# Patient Record
Sex: Female | Born: 1985 | Race: Black or African American | Hispanic: Yes | Marital: Married | Smoking: Never smoker
Health system: Southern US, Community
[De-identification: ages and names within clinical notes are randomized; demographics above are authoritative.]

## PROBLEM LIST (undated history)

## (undated) DIAGNOSIS — F419 Anxiety disorder, unspecified: Secondary | ICD-10-CM

## (undated) DIAGNOSIS — G2581 Restless legs syndrome: Secondary | ICD-10-CM

## (undated) HISTORY — PX: WISDOM TOOTH EXTRACTION: SHX21

---

## 2020-08-30 DIAGNOSIS — Z3400 Encounter for supervision of normal first pregnancy, unspecified trimester: Secondary | ICD-10-CM | POA: Diagnosis not present

## 2020-08-30 DIAGNOSIS — Z124 Encounter for screening for malignant neoplasm of cervix: Secondary | ICD-10-CM | POA: Diagnosis not present

## 2020-08-30 DIAGNOSIS — Z0373 Encounter for suspected fetal anomaly ruled out: Secondary | ICD-10-CM | POA: Diagnosis not present

## 2020-08-30 DIAGNOSIS — Z3402 Encounter for supervision of normal first pregnancy, second trimester: Secondary | ICD-10-CM | POA: Diagnosis not present

## 2020-09-11 DIAGNOSIS — O09512 Supervision of elderly primigravida, second trimester: Secondary | ICD-10-CM | POA: Diagnosis not present

## 2020-09-11 DIAGNOSIS — Z3A18 18 weeks gestation of pregnancy: Secondary | ICD-10-CM | POA: Diagnosis not present

## 2020-09-11 LAB — OB RESULTS CONSOLE HEPATITIS B SURFACE ANTIGEN: Hepatitis B Surface Ag: NEGATIVE

## 2020-09-11 LAB — OB RESULTS CONSOLE HIV ANTIBODY (ROUTINE TESTING): HIV: NONREACTIVE

## 2020-09-11 LAB — OB RESULTS CONSOLE RUBELLA ANTIBODY, IGM: Rubella: NON-IMMUNE/NOT IMMUNE

## 2020-09-12 ENCOUNTER — Inpatient Hospital Stay (HOSPITAL_COMMUNITY): Admit: 2020-09-12 | Payer: BC Managed Care – PPO | Admitting: Obstetrics and Gynecology

## 2020-09-18 DIAGNOSIS — O09512 Supervision of elderly primigravida, second trimester: Secondary | ICD-10-CM | POA: Diagnosis not present

## 2020-09-18 DIAGNOSIS — Z3A19 19 weeks gestation of pregnancy: Secondary | ICD-10-CM | POA: Diagnosis not present

## 2020-10-03 DIAGNOSIS — Z362 Encounter for other antenatal screening follow-up: Secondary | ICD-10-CM | POA: Diagnosis not present

## 2020-10-03 DIAGNOSIS — O09512 Supervision of elderly primigravida, second trimester: Secondary | ICD-10-CM | POA: Diagnosis not present

## 2020-10-03 DIAGNOSIS — Z3A21 21 weeks gestation of pregnancy: Secondary | ICD-10-CM | POA: Diagnosis not present

## 2020-10-15 DIAGNOSIS — S93601A Unspecified sprain of right foot, initial encounter: Secondary | ICD-10-CM | POA: Diagnosis not present

## 2020-10-15 DIAGNOSIS — M79671 Pain in right foot: Secondary | ICD-10-CM | POA: Diagnosis not present

## 2020-10-15 DIAGNOSIS — O09512 Supervision of elderly primigravida, second trimester: Secondary | ICD-10-CM | POA: Diagnosis not present

## 2020-10-15 DIAGNOSIS — Z3A23 23 weeks gestation of pregnancy: Secondary | ICD-10-CM | POA: Diagnosis not present

## 2020-11-13 DIAGNOSIS — Z03818 Encounter for observation for suspected exposure to other biological agents ruled out: Secondary | ICD-10-CM | POA: Diagnosis not present

## 2020-11-16 NOTE — L&D Delivery Note (Signed)
Delivery Note:   G1P0 at [redacted]w[redacted]d  Admitting diagnosis: Encounter for induction of labor [Z34.90] Risks: Marginal abruption, AMA Onset of labor: 0348 IOL/Augmentation: Pitocin ROM: 02/10/2021 0815  Complete dilation at 02/10/2021  0815 Onset of pushing at 0815 FHR second stage Cat I  Analgesia /Anesthesia intrapartum:Local  Pushing in hands and knees position on the floor with CNM and L&D staff support. Taja, wife, present for birth and supportive.  Delivery of a Live born female  Birth Weight:  2829g, 6lb 3.8oz APGAR: 9, 9  Newborn Delivery   Birth date/time: 02/10/2021 08:20:00 Delivery type: Vaginal, Spontaneous     in cephalic presentation, position OA to LOA.  APGAR:1 min-9 , 5 min-9   Nuchal Cord: No  Cord double clamped after cessation of pulsation, cut by Taja.  Collection of cord blood for typing completed. Cord blood donation-None  Arterial cord blood sample-No    Placenta delivered-Pathology  with 3 vessels . Uterotonics: Pitocin Placenta to pathology Uterine tone firm  Bleeding scant  Left sulcal laceration identified.  Episiotomy:None  Local analgesia: Lidocaine  Repair: 2-0 Vicryl in usual fashion with excellent hemostasis Est. Blood Loss (mL):100.00   Complications: None  Mom to postpartum. Baby Wells to Couplet care / Skin to Skin.  Delivery Report:   Review the Delivery Report for details.    June Leap, CNM, MSN 02/10/2021, 9:29 AM

## 2020-11-22 DIAGNOSIS — Z3689 Encounter for other specified antenatal screening: Secondary | ICD-10-CM | POA: Diagnosis not present

## 2020-11-22 DIAGNOSIS — Z23 Encounter for immunization: Secondary | ICD-10-CM | POA: Diagnosis not present

## 2020-12-08 ENCOUNTER — Other Ambulatory Visit: Payer: Self-pay

## 2020-12-08 ENCOUNTER — Encounter (HOSPITAL_COMMUNITY): Payer: Self-pay

## 2020-12-08 ENCOUNTER — Inpatient Hospital Stay (HOSPITAL_COMMUNITY)
Admission: AD | Admit: 2020-12-08 | Discharge: 2020-12-08 | Discharge status: Against Medical Advice | Payer: BC Managed Care – PPO | Attending: Obstetrics and Gynecology | Admitting: Obstetrics and Gynecology

## 2020-12-08 DIAGNOSIS — Z3689 Encounter for other specified antenatal screening: Secondary | ICD-10-CM

## 2020-12-08 DIAGNOSIS — O09513 Supervision of elderly primigravida, third trimester: Secondary | ICD-10-CM | POA: Insufficient documentation

## 2020-12-08 DIAGNOSIS — Z3A30 30 weeks gestation of pregnancy: Secondary | ICD-10-CM | POA: Diagnosis not present

## 2020-12-08 DIAGNOSIS — O368130 Decreased fetal movements, third trimester, not applicable or unspecified: Secondary | ICD-10-CM | POA: Diagnosis not present

## 2020-12-08 HISTORY — DX: Anxiety disorder, unspecified: F41.9

## 2020-12-08 HISTORY — DX: Restless legs syndrome: G25.81

## 2020-12-08 LAB — URINALYSIS, COMPLETE (UACMP) WITH MICROSCOPIC
Bilirubin Urine: NEGATIVE
Glucose, UA: NEGATIVE mg/dL
Hgb urine dipstick: NEGATIVE
Ketones, ur: NEGATIVE mg/dL
Leukocytes,Ua: NEGATIVE
Nitrite: NEGATIVE
Protein, ur: NEGATIVE mg/dL
Specific Gravity, Urine: 1.008 (ref 1.005–1.030)
pH: 8 (ref 5.0–8.0)

## 2020-12-08 NOTE — MAU Note (Signed)
Pt reports to mau with c/o DFM since Thursday.  Pt reports feeling some movement but not her normal.  Pt denies ctx or LOF. Denies vag bleeding. FHR 140

## 2020-12-08 NOTE — MAU Provider Note (Signed)
  History     CSN: [id number]  Arrival date and time: 12/08/20 1137   None    Chief Complaint  Patient presents with  . Decreased Fetal Movement   HPI Patient Megan Oconnor is a 35 y.o. G1P0 at [redacted]w[redacted]d here with complaints of decreased fetal movements. She reports that baby has not been moving as much since Thursday. Normally she feels kicks, but it has been more like rolling motions lately. She has had a normal pregnancy thus far, denies any pain with urination, dysuria, vaginal discharge, LOF, vaginal bleeding, HA, blurry vision.    OB History    Gravida  1   Para      Term      Preterm      AB      Living        SAB      IAB      Ectopic      Multiple      Live Births              Past Medical History:  Diagnosis Date  . Anxiety   . Restless leg syndrome     History reviewed. No pertinent family history.  Social History   Tobacco Use  . Smoking status: Never Smoker  . Smokeless tobacco: Never Used  Substance Use Topics  . Alcohol use: Not Currently  . Drug use: Never    Allergies: No Known Allergies  No medications prior to admission.    Review of Systems  Constitutional: Negative.   HENT: Negative.   Eyes: Visual disturbance:         Respiratory: Negative.   Neurological: Negative.   Psychiatric/Behavioral: Negative.    Physical Exam   Blood pressure 111/71, pulse 97, temperature 98.4 F (36.9 C), temperature source Oral, resp. rate 16, SpO2 99 %.  Physical Exam Constitutional:      Appearance: Normal appearance.  Cardiovascular:     Rate and Rhythm: Normal rate.  Pulmonary:     Effort: Pulmonary effort is normal.  Abdominal:     General: Abdomen is flat.  Neurological:     Mental Status: She is alert.   Cervix is long, closed, posterior.    MAU Course  Procedures  MDM -Patient was monitored for 90 minutes; she felt strong fetal movements while in MAU.  -NST: 140 bpm, mod var, present acel, no decels, irregular  contractions. Patient did not feel contractions; however, FFN collected. Cervix examined and was closed, long, posterior to FFN not sent. No further work-up for pre-term labor was performed.     Assessment and Plan   1. NST (non-stress test) reactive   -Patient stable for discharge with return precautions, reviewed normal fetal movements.  -Keep upcoming appt with [facility] -Return to MAU if any concerns for fetal movements.    Patient left before discharge could be completed.   Charlesetta Garibaldi Demarqus Jocson 12/08/2020, 7:23 PM

## 2020-12-09 ENCOUNTER — Ambulatory Visit (INDEPENDENT_AMBULATORY_CARE_PROVIDER_SITE_OTHER): Payer: BC Managed Care – PPO | Admitting: Family Medicine

## 2020-12-09 ENCOUNTER — Encounter: Payer: Self-pay | Admitting: Family Medicine

## 2020-12-09 VITALS — BP 120/72 | HR 61 | Temp 98.0°F | Wt 162.0 lb

## 2020-12-09 DIAGNOSIS — Z7689 Persons encountering health services in other specified circumstances: Secondary | ICD-10-CM

## 2020-12-09 DIAGNOSIS — N644 Mastodynia: Secondary | ICD-10-CM

## 2020-12-09 DIAGNOSIS — Z3A30 30 weeks gestation of pregnancy: Secondary | ICD-10-CM

## 2020-12-09 NOTE — Progress Notes (Signed)
   Subjective:    Patient ID: Megan Oconnor, female    DOB: 12/27/1985, 35 y.o.   MRN: [id number]  HPI Chief Complaint  Patient presents with  . new pt     New pt right breast pain, since being pregant. 8 months pregnant. Had obgyn take a look and everything was fine but wants second opinion    She is new to the practice and here for an acute complaint of breast pain. She is [redacted] weeks pregnant.  Previous medical care: she moved here from [place] in October. She was under the care of her OB/GYN and fertility specialist in [place].   Other providers: OB/GYN - Dr. Juliene Pina   States she has had right breast pain and tenderness for months. States she has seen her OB/GYN, Dr. Juliene Pina here for the same. States she wants a second opinion.  Denies any skin changes to her breasts or nipple discharge.  States she has dense breasts in general.  History of biopsy at [facility]. Results were benign per patient. No records available today.   States her pregnancy has been fine and no issues.  She was evaluated yesterday.   Denies fever, chills, chest pain, palpitations, shortness of breath, abdominal pain, N/V/D, urinary symptoms or vaginal discharge.    Social history: Lives with her wife.  works from home.  Denies smoking, drinking alcohol, drug use    Reviewed allergies, medications, past medical, surgical, family, and social history.    Review of Systems Pertinent positives and negatives in the history of present illness.     Objective:   Physical Exam Constitutional:      Appearance: Normal appearance.  Cardiovascular:     Rate and Rhythm: Normal rate and regular rhythm.     Pulses: Normal pulses.     Heart sounds: Normal heart sounds.  Pulmonary:     Effort: Pulmonary effort is normal.     Breath sounds: Normal breath sounds.  Chest:  Breasts:     Right: No mass, nipple discharge or skin change.     Left: Normal.      Comments: Firmness to right breast in the upper inner and outer  quadrants with no palpable discreet mass.  Musculoskeletal:     Cervical back: Neck supple.  Skin:    General: Skin is warm and dry.  Neurological:     General: No focal deficit present.     Mental Status: She is alert and oriented to person, place, and time.  Psychiatric:        Mood and Affect: Mood normal.    BP 120/72   Pulse 61   Temp 98 F (36.7 C)   Wt 162 lb (73.5 kg)         Assessment & Plan:  Breast pain  [redacted] weeks gestation of pregnancy  Encounter to establish care  She is a pleasant 35 year old new female here to establish care. I will request medical records from her providers in [place].  Discussed that I agree with her OB/GYN that it is ok to wait until after delivery of her baby to evaluate her breast symptoms further. She will monitor for any new or worsening symptoms especially any signs of infection and follow up with her OB/GYN.

## 2020-12-09 NOTE — Patient Instructions (Signed)
It was a pleasure to meet you.  Keep an eye on things and let Dr. Juliene Pina know if you are experiencing any new symptoms.

## 2020-12-11 DIAGNOSIS — S93601A Unspecified sprain of right foot, initial encounter: Secondary | ICD-10-CM | POA: Diagnosis not present

## 2020-12-11 DIAGNOSIS — M79671 Pain in right foot: Secondary | ICD-10-CM | POA: Diagnosis not present

## 2020-12-17 DIAGNOSIS — O2613 Low weight gain in pregnancy, third trimester: Secondary | ICD-10-CM | POA: Diagnosis not present

## 2020-12-17 DIAGNOSIS — O09529 Supervision of elderly multigravida, unspecified trimester: Secondary | ICD-10-CM | POA: Diagnosis not present

## 2020-12-17 DIAGNOSIS — O365930 Maternal care for other known or suspected poor fetal growth, third trimester, not applicable or unspecified: Secondary | ICD-10-CM | POA: Diagnosis not present

## 2020-12-17 DIAGNOSIS — Z3A32 32 weeks gestation of pregnancy: Secondary | ICD-10-CM | POA: Diagnosis not present

## 2021-01-16 DIAGNOSIS — Z3A36 36 weeks gestation of pregnancy: Secondary | ICD-10-CM | POA: Diagnosis not present

## 2021-01-16 DIAGNOSIS — Z3685 Encounter for antenatal screening for Streptococcus B: Secondary | ICD-10-CM | POA: Diagnosis not present

## 2021-01-16 DIAGNOSIS — O2613 Low weight gain in pregnancy, third trimester: Secondary | ICD-10-CM | POA: Diagnosis not present

## 2021-01-23 DIAGNOSIS — Z3685 Encounter for antenatal screening for Streptococcus B: Secondary | ICD-10-CM | POA: Diagnosis not present

## 2021-01-23 LAB — OB RESULTS CONSOLE GBS: GBS: NEGATIVE

## 2021-02-06 DIAGNOSIS — O368399 Maternal care for abnormalities of the fetal heart rate or rhythm, unspecified trimester, other fetus: Secondary | ICD-10-CM | POA: Diagnosis not present

## 2021-02-06 DIAGNOSIS — Z3A38 38 weeks gestation of pregnancy: Secondary | ICD-10-CM | POA: Diagnosis not present

## 2021-02-09 ENCOUNTER — Inpatient Hospital Stay (HOSPITAL_COMMUNITY)
Admission: AD | Admit: 2021-02-09 | Discharge: 2021-02-11 | DRG: 805 | Disposition: A | Discharge status: Home / Self Care | Payer: BC Managed Care – PPO | Attending: Obstetrics | Admitting: Obstetrics

## 2021-02-09 ENCOUNTER — Encounter (HOSPITAL_COMMUNITY): Payer: Self-pay | Admitting: Obstetrics & Gynecology

## 2021-02-09 DIAGNOSIS — O368130 Decreased fetal movements, third trimester, not applicable or unspecified: Principal | ICD-10-CM | POA: Diagnosis present

## 2021-02-09 DIAGNOSIS — O4593 Premature separation of placenta, unspecified, third trimester: Secondary | ICD-10-CM | POA: Diagnosis present

## 2021-02-09 DIAGNOSIS — O368190 Decreased fetal movements, unspecified trimester, not applicable or unspecified: Secondary | ICD-10-CM

## 2021-02-09 DIAGNOSIS — O4590 Premature separation of placenta, unspecified, unspecified trimester: Secondary | ICD-10-CM | POA: Diagnosis present

## 2021-02-09 DIAGNOSIS — Z23 Encounter for immunization: Secondary | ICD-10-CM

## 2021-02-09 DIAGNOSIS — Z3490 Encounter for supervision of normal pregnancy, unspecified, unspecified trimester: Secondary | ICD-10-CM | POA: Diagnosis present

## 2021-02-09 DIAGNOSIS — Z3A38 38 weeks gestation of pregnancy: Secondary | ICD-10-CM

## 2021-02-09 NOTE — MAU Provider Note (Signed)
S Ms. Megan Oconnor is a 35 y.o. G1P0 patient who presents to MAU today with complaint of decreased fetal movement..   O BP (!) 132/91   Pulse 69   Resp 16   Wt 74.9 kg   SpO2 99%  Physical Exam Vitals and nursing note reviewed.  Constitutional:      General: She is not in acute distress.    Appearance: She is well-developed.  HENT:     Head: Normocephalic.  Cardiovascular:     Rate and Rhythm: Normal rate.  Pulmonary:     Effort: Pulmonary effort is normal. No respiratory distress.  Skin:    General: Skin is warm and dry.  Neurological:     Mental Status: She is alert and oriented to person, place, and time.  Psychiatric:        Behavior: Behavior normal.        Thought Content: Thought content normal.        Judgment: Judgment normal.     A Medical screening exam complete   P Patient is being managed by CNM from [facility]. Medical screening completed per policy.  Rolm Bookbinder, [place] 02/09/2021 11:58 PM

## 2021-02-09 NOTE — Progress Notes (Deleted)
S Ms. Megan Oconnor is a 34 y.o. G1P0 patient who presents to MAU today with complaint of decreased fetal movement..   O BP (!) 132/91   Pulse 69   Resp 16   Wt 74.9 kg   SpO2 99%  Physical Exam Vitals and nursing note reviewed.  Constitutional:      General: She is not in acute distress.    Appearance: She is well-developed.  HENT:     Head: Normocephalic.  Cardiovascular:     Rate and Rhythm: Normal rate.  Pulmonary:     Effort: Pulmonary effort is normal. No respiratory distress.  Skin:    General: Skin is warm and dry.  Neurological:     Mental Status: She is alert and oriented to person, place, and time.  Psychiatric:        Behavior: Behavior normal.        Thought Content: Thought content normal.        Judgment: Judgment normal.     A Medical screening exam complete   P Patient is being managed by CNM from [facility]. Medical screening completed per policy.  Neill, Caroline M, CNM 02/09/2021 11:58 PM     

## 2021-02-09 NOTE — MAU Note (Signed)
Reports decreased FM that started today. Reports baby is usually very active in the afternoon on, but hasnt felt baby move. Pt called office and was told to come to MAU for NST. Dorisann Frames notified and BPP and NST ordered.

## 2021-02-10 ENCOUNTER — Inpatient Hospital Stay (HOSPITAL_BASED_OUTPATIENT_CLINIC_OR_DEPARTMENT_OTHER): Payer: BC Managed Care – PPO

## 2021-02-10 ENCOUNTER — Encounter (HOSPITAL_COMMUNITY): Payer: Self-pay | Admitting: Obstetrics

## 2021-02-10 ENCOUNTER — Other Ambulatory Visit: Payer: Self-pay

## 2021-02-10 DIAGNOSIS — Z2882 Immunization not carried out because of caregiver refusal: Secondary | ICD-10-CM | POA: Diagnosis not present

## 2021-02-10 DIAGNOSIS — Z3A38 38 weeks gestation of pregnancy: Secondary | ICD-10-CM | POA: Diagnosis not present

## 2021-02-10 DIAGNOSIS — Z3490 Encounter for supervision of normal pregnancy, unspecified, unspecified trimester: Secondary | ICD-10-CM | POA: Diagnosis present

## 2021-02-10 DIAGNOSIS — O4590 Premature separation of placenta, unspecified, unspecified trimester: Secondary | ICD-10-CM | POA: Diagnosis present

## 2021-02-10 DIAGNOSIS — O4593 Premature separation of placenta, unspecified, third trimester: Secondary | ICD-10-CM | POA: Diagnosis not present

## 2021-02-10 DIAGNOSIS — O368130 Decreased fetal movements, third trimester, not applicable or unspecified: Secondary | ICD-10-CM | POA: Diagnosis not present

## 2021-02-10 DIAGNOSIS — Z3A39 39 weeks gestation of pregnancy: Secondary | ICD-10-CM

## 2021-02-10 DIAGNOSIS — Z23 Encounter for immunization: Secondary | ICD-10-CM | POA: Diagnosis not present

## 2021-02-10 DIAGNOSIS — O43893 Other placental disorders, third trimester: Secondary | ICD-10-CM | POA: Diagnosis not present

## 2021-02-10 LAB — CBC WITH DIFFERENTIAL/PLATELET
Abs Immature Granulocytes: 0.01 10*3/uL (ref 0.00–0.07)
Basophils Absolute: 0 10*3/uL (ref 0.0–0.1)
Basophils Relative: 1 %
Eosinophils Absolute: 0.1 10*3/uL (ref 0.0–0.5)
Eosinophils Relative: 1 %
HCT: 35.8 % — ABNORMAL LOW (ref 36.0–46.0)
Hemoglobin: 12.1 g/dL (ref 12.0–15.0)
Immature Granulocytes: 0 %
Lymphocytes Relative: 23 %
Lymphs Abs: 1.5 10*3/uL (ref 0.7–4.0)
MCH: 30.5 pg (ref 26.0–34.0)
MCHC: 33.8 g/dL (ref 30.0–36.0)
MCV: 90.2 fL (ref 80.0–100.0)
Monocytes Absolute: 0.4 10*3/uL (ref 0.1–1.0)
Monocytes Relative: 6 %
Neutro Abs: 4.5 10*3/uL (ref 1.7–7.7)
Neutrophils Relative %: 69 %
Platelets: 215 10*3/uL (ref 150–400)
RBC: 3.97 MIL/uL (ref 3.87–5.11)
RDW: 14.2 % (ref 11.5–15.5)
WBC: 6.4 10*3/uL (ref 4.0–10.5)
nRBC: 0 % (ref 0.0–0.2)

## 2021-02-10 LAB — COMPREHENSIVE METABOLIC PANEL
ALT: 17 U/L (ref 0–44)
AST: 26 U/L (ref 15–41)
Albumin: 2.8 g/dL — ABNORMAL LOW (ref 3.5–5.0)
Alkaline Phosphatase: 173 U/L — ABNORMAL HIGH (ref 38–126)
Anion gap: 6 (ref 5–15)
BUN: 13 mg/dL (ref 6–20)
CO2: 20 mmol/L — ABNORMAL LOW (ref 22–32)
Calcium: 8.9 mg/dL (ref 8.9–10.3)
Chloride: 107 mmol/L (ref 98–111)
Creatinine, Ser: 0.98 mg/dL (ref 0.44–1.00)
GFR, Estimated: >60 mL/min (ref >60)
Glucose, Bld: 124 mg/dL — ABNORMAL HIGH (ref 70–99)
Potassium: 3.3 mmol/L — ABNORMAL LOW (ref 3.5–5.1)
Sodium: 133 mmol/L — ABNORMAL LOW (ref 135–145)
Total Bilirubin: 0.5 mg/dL (ref 0.3–1.2)
Total Protein: 6 g/dL — ABNORMAL LOW (ref 6.5–8.1)

## 2021-02-10 LAB — APTT: aPTT: 30 seconds (ref 24–36)

## 2021-02-10 LAB — URIC ACID: Uric Acid, Serum: 6.8 mg/dL (ref 2.5–7.1)

## 2021-02-10 LAB — TYPE AND SCREEN
ABO/RH(D): A POS
Antibody Screen: NEGATIVE

## 2021-02-10 LAB — PROTIME-INR
INR: 0.9 (ref 0.8–1.2)
Prothrombin Time: 11.6 seconds (ref 11.4–15.2)

## 2021-02-10 LAB — PROTEIN / CREATININE RATIO, URINE
Creatinine, Urine: 53.11 mg/dL
Total Protein, Urine: <6 mg/dL

## 2021-02-10 LAB — RPR: RPR Ser Ql: NONREACTIVE

## 2021-02-10 LAB — LACTATE DEHYDROGENASE: LDH: 155 U/L (ref 98–192)

## 2021-02-10 MED ORDER — ACETAMINOPHEN 500 MG PO TABS: 1000.0000 mg | ORAL_TABLET | Freq: Four times a day (QID) | ORAL | Status: DC | PRN

## 2021-02-10 MED ORDER — SOD CITRATE-CITRIC ACID 500-334 MG/5ML PO SOLN
30.0000 mL | ORAL | Status: DC | PRN
  Administered 2021-02-10: 30 mL via ORAL
  Filled 2021-02-10: qty 15

## 2021-02-10 MED ORDER — DIBUCAINE (PERIANAL) 1 % EX OINT
1.0000 "application " | TOPICAL_OINTMENT | CUTANEOUS | Status: DC | PRN
  Administered 2021-02-10: 1 via RECTAL
  Filled 2021-02-10: qty 28

## 2021-02-10 MED ORDER — ZOLPIDEM TARTRATE 5 MG PO TABS: 5.0000 mg | ORAL_TABLET | Freq: Every evening | ORAL | Status: DC | PRN

## 2021-02-10 MED ORDER — OXYTOCIN-SODIUM CHLORIDE 30-0.9 UT/500ML-% IV SOLN
2.5000 [IU]/h | INTRAVENOUS | Status: DC
  Administered 2021-02-10: 2.5 [IU]/h via INTRAVENOUS

## 2021-02-10 MED ORDER — PRENATAL MULTIVITAMIN CH
1.0000 | ORAL_TABLET | Freq: Every day | ORAL | Status: DC
  Administered 2021-02-11: 1 via ORAL
  Filled 2021-02-10: qty 1

## 2021-02-10 MED ORDER — FENTANYL CITRATE (PF) 100 MCG/2ML IJ SOLN
50.0000 ug | INTRAMUSCULAR | Status: DC | PRN
  Administered 2021-02-10: 50 ug via INTRAVENOUS
  Filled 2021-02-10: qty 2

## 2021-02-10 MED ORDER — ONDANSETRON HCL 4 MG PO TABS: 4.0000 mg | ORAL_TABLET | ORAL | Status: DC | PRN

## 2021-02-10 MED ORDER — TERBUTALINE SULFATE 1 MG/ML IJ SOLN: 0.2500 mg | Freq: Once | INTRAMUSCULAR | Status: DC | PRN

## 2021-02-10 MED ORDER — LACTATED RINGERS IV SOLN: 500.0000 mL | INTRAVENOUS | Status: DC | PRN

## 2021-02-10 MED ORDER — SODIUM CHLORIDE 0.9% FLUSH: 3.0000 mL | INTRAVENOUS | Status: DC | PRN

## 2021-02-10 MED ORDER — DIPHENHYDRAMINE HCL 25 MG PO CAPS: 25.0000 mg | ORAL_CAPSULE | Freq: Four times a day (QID) | ORAL | Status: DC | PRN

## 2021-02-10 MED ORDER — TETANUS-DIPHTH-ACELL PERTUSSIS 5-2.5-18.5 LF-MCG/0.5 IM SUSY: 0.5000 mL | PREFILLED_SYRINGE | Freq: Once | INTRAMUSCULAR | Status: DC

## 2021-02-10 MED ORDER — ONDANSETRON HCL 4 MG/2ML IJ SOLN: 4.0000 mg | Freq: Four times a day (QID) | INTRAMUSCULAR | Status: DC | PRN

## 2021-02-10 MED ORDER — LACTATED RINGERS IV SOLN: INTRAVENOUS | Status: DC

## 2021-02-10 MED ORDER — COCONUT OIL OIL
1.0000 | TOPICAL_OIL | Status: DC | PRN
Start: 2021-02-10 — End: 2021-02-12

## 2021-02-10 MED ORDER — ONDANSETRON HCL 4 MG/2ML IJ SOLN: 4.0000 mg | INTRAMUSCULAR | Status: DC | PRN

## 2021-02-10 MED ORDER — SIMETHICONE 80 MG PO CHEW: 80.0000 mg | CHEWABLE_TABLET | ORAL | Status: DC | PRN

## 2021-02-10 MED ORDER — OXYTOCIN BOLUS FROM INFUSION
333.0000 mL | Freq: Once | INTRAVENOUS | Status: AC
  Administered 2021-02-10: 333 mL via INTRAVENOUS

## 2021-02-10 MED ORDER — WITCH HAZEL-GLYCERIN EX PADS
1.0000 | MEDICATED_PAD | CUTANEOUS | Status: DC | PRN
Start: 2021-02-10 — End: 2021-02-12
  Administered 2021-02-10: 1 via TOPICAL

## 2021-02-10 MED ORDER — SENNOSIDES-DOCUSATE SODIUM 8.6-50 MG PO TABS
2.0000 | ORAL_TABLET | Freq: Every day | ORAL | Status: DC
  Filled 2021-02-10: qty 2

## 2021-02-10 MED ORDER — ACETAMINOPHEN 325 MG PO TABS: 650.0000 mg | ORAL_TABLET | ORAL | Status: DC | PRN

## 2021-02-10 MED ORDER — SODIUM CHLORIDE 0.9% FLUSH: 3.0000 mL | Freq: Two times a day (BID) | INTRAVENOUS | Status: DC

## 2021-02-10 MED ORDER — IBUPROFEN 600 MG PO TABS: 600.0000 mg | ORAL_TABLET | Freq: Four times a day (QID) | ORAL | Status: DC

## 2021-02-10 MED ORDER — SODIUM CHLORIDE 0.9 % IV SOLN: 250.0000 mL | INTRAVENOUS | Status: DC | PRN

## 2021-02-10 MED ORDER — BENZOCAINE-MENTHOL 20-0.5 % EX AERO
1.0000 | INHALATION_SPRAY | CUTANEOUS | Status: DC | PRN
Start: 2021-02-10 — End: 2021-02-12
  Administered 2021-02-10: 1 via TOPICAL
  Filled 2021-02-10: qty 56

## 2021-02-10 MED ORDER — LACTATED RINGERS IV BOLUS
500.0000 mL | Freq: Once | INTRAVENOUS | Status: AC
  Administered 2021-02-10: 500 mL via INTRAVENOUS

## 2021-02-10 MED ORDER — OXYTOCIN 10 UNIT/ML IJ SOLN: 10.0000 [IU] | Freq: Once | INTRAMUSCULAR | Status: DC

## 2021-02-10 MED ORDER — LIDOCAINE HCL (PF) 1 % IJ SOLN
30.0000 mL | INTRAMUSCULAR | Status: AC | PRN
  Administered 2021-02-10: 30 mL via SUBCUTANEOUS
  Filled 2021-02-10: qty 30

## 2021-02-10 MED ORDER — OXYTOCIN-SODIUM CHLORIDE 30-0.9 UT/500ML-% IV SOLN
1.0000 m[IU]/min | INTRAVENOUS | Status: DC
  Administered 2021-02-10: 2 m[IU]/min via INTRAVENOUS
  Filled 2021-02-10: qty 500

## 2021-02-10 NOTE — H&P (Signed)
OB ADMISSION/ HISTORY & PHYSICAL:  Admission Date: 02/09/2021 11:19 PM  Admit Diagnosis: Encounter for induction of labor [Z34.90]    Megan Oconnor is a 35 y.o. female at 61w4dpresenting initially for decreased fetal movement. Patient contacted CNM by phone and stated that she had decreased fetal movement throughout the day and no movement for the last several hours. Patient was sent to MAU for evaluation. Initial NST in MAU was reactive and reassuring. Patient was sent for U/S that showed a marginal placental abruption and BPP 8/8. Patient denies abdominal pain or vaginal bleeding. Reports occasional contractions. Denies leaking of fluid.   Patient and wife counseled on the management of marginal abruption at term and both desire to proceed with induction of labor. Discussed the R/B/A of induction of labor including risk for PLTCS for fetal distress and patient consents to induction.   Wife, TFransico Meadow is labor support. Eagerly anticipating baby girl "Wells".   Prenatal History: G1P0   EDC : 02/20/2021, by 7 week U/S Prenatal care at [facility] since 24 weeks, transfer of care [facility] primary Dr. MBenjie Karvonen transferred to midwifery care at 32 weeks  Prenatal course complicated by: 1. Low maternal weight gain 2. AMA 3. Rubella non-immune 4. Anxiety  Prenatal Labs: ABO, Rh:   A POS Antibody: NEG (03/28 0115) Rubella:   Non-immune RPR:   Non-reactive HBsAg:   Negative HIV:   Negative GBS:   Negative 1 hr Glucola : 92 Genetic Screening: low risk XX Panorama, neg AFP-1 Ultrasound: normal XX anatomy, anterior placenta, AFI WNL, last EFW at 34 weeks 23% 02/10/2021 - small inferior marginal placental abruption measuring 4.2 x 1.1 x 2cm  TDaP         UTD Flu             UTD COVID-19 UTD    Maternal Diabetes: No Genetic Screening: Normal Maternal Ultrasounds/Referrals: Other: marginal placental abruption on 02/10/2021 Fetal Ultrasounds or other Referrals:  None Maternal Substance Abuse:   No Significant Maternal Medications:  None Significant Maternal Lab Results:  Group B Strep negative Other Comments:  None  Medical / Surgical History : Past medical history:  Past Medical History:  Diagnosis Date  . Anxiety   . Restless leg syndrome     Past surgical history:  Past Surgical History:  Procedure Laterality Date  . WISDOM TOOTH EXTRACTION      Family History: History reviewed. No pertinent family history.   Social History:  reports that she has never smoked. She has never used smokeless tobacco. She reports previous alcohol use. She reports that she does not use drugs.  Allergies: Patient has no known allergies.   Current Medications at time of admission:  Medications Prior to Admission  Medication Sig Dispense Refill Last Dose  . calcium carbonate (TUMS - DOSED IN MG ELEMENTAL CALCIUM) 500 MG chewable tablet Chew 1 tablet by mouth daily.   02/09/2021 at Unknown time  . MAGNESIUM CARBONATE PO Take by mouth.   02/09/2021 at Unknown time  . Prenatal Vit-Fe Fumarate-FA (MULTIVITAMIN-PRENATAL) 27-0.8 MG TABS tablet Take 1 tablet by mouth daily at 12 noon.   02/09/2021 at Unknown time  . valACYclovir (VALTREX) 1000 MG tablet Take 1,000 mg by mouth daily.   02/09/2021 at Unknown time  . ZINC OXIDE PO Take by mouth.   02/09/2021 at Unknown time  . Docosahexaenoic Acid (DHA PO) Take by mouth.     . famotidine (PEPCID) 10 MG tablet Take 10 mg by mouth 2 (two)  times daily.   Unknown at Unknown time    Review of Systems: Review of Systems  Constitutional: Negative for chills and fever.  Eyes: Negative for blurred vision.  Respiratory: Negative for cough and shortness of breath.   Cardiovascular: Negative for chest pain and leg swelling.  Gastrointestinal: Positive for constipation, heartburn, nausea and vomiting. Negative for abdominal pain.  Musculoskeletal: Negative for falls.  Neurological: Negative for dizziness and headaches.  Psychiatric/Behavioral: Negative for  depression.   Physical Exam: Vital signs and nursing notes reviewed.  Patient Vitals for the past 24 hrs:  BP Temp src Pulse Resp SpO2 Height Weight  02/10/21 0325 121/89 -- 76 18 -- -- --  02/10/21 0324 -- Oral -- 18 -- 5' 9"  (1.753 m) 74.4 kg  02/10/21 0308 (!) 134/91 -- 71 18 -- -- --  02/10/21 0230 (!) 132/100 -- 74 -- -- -- --  02/10/21 0215 (!) 134/103 -- 76 -- -- -- --  02/10/21 0145 123/90 -- 73 -- -- -- --  02/10/21 0130 115/87 -- 82 -- -- -- --  02/10/21 0115 119/90 -- 71 19 -- -- --  02/10/21 0100 136/83 -- 61 -- -- -- --  02/10/21 0051 108/82 -- 76 -- -- -- --  02/10/21 0050 114/81 -- 69 17 -- -- --  02/10/21 0000 -- -- -- -- 99 % -- --  02/09/21 2355 -- -- -- -- 99 % -- --  02/09/21 2350 -- -- -- -- 98 % -- --  02/09/21 2345 -- -- -- -- 98 % -- --  02/09/21 2344 -- -- -- -- 98 % -- --  02/09/21 2343 -- -- -- 16 -- -- 74.9 kg  02/09/21 2340 -- -- -- -- 99 % -- --  02/09/21 2336 (!) 132/91 -- 69 -- -- -- --    General: AAO x 3, NAD Heart: RRR Lungs:CTAB Abdomen: Gravid, NT, Leopold's vertex Extremities: no edema Genitalia / VE: Dilation: 3 Effacement (%): 60 Station: -2 Presentation: Vertex Exam by:: Florette Thai CNM   FHR: 120BPM, mod variability, + accels, no decels TOCO: Contractions occasional  Labs:   Results for orders placed or performed during the hospital encounter of 02/09/21 (from the past 24 hour(s))  Comprehensive metabolic panel     Status: Abnormal   Collection Time: 02/10/21  1:02 AM  Result Value Ref Range   Sodium 133 (L) 135 - 145 mmol/L   Potassium 3.3 (L) 3.5 - 5.1 mmol/L   Chloride 107 98 - 111 mmol/L   CO2 20 (L) 22 - 32 mmol/L   Glucose, Bld 124 (H) 70 - 99 mg/dL   BUN 13 6 - 20 mg/dL   Creatinine, Ser 0.98 0.44 - 1.00 mg/dL   Calcium 8.9 8.9 - 10.3 mg/dL   Total Protein 6.0 (L) 6.5 - 8.1 g/dL   Albumin 2.8 (L) 3.5 - 5.0 g/dL   AST 26 15 - 41 U/L   ALT 17 0 - 44 U/L   Alkaline Phosphatase 173 (H) 38 - 126 U/L   Total Bilirubin  0.5 0.3 - 1.2 mg/dL   GFR, Estimated >60 >60 mL/min   Anion gap 6 5 - 15  CBC with Differential     Status: Abnormal   Collection Time: 02/10/21  1:02 AM  Result Value Ref Range   WBC 6.4 4.0 - 10.5 K/uL   RBC 3.97 3.87 - 5.11 MIL/uL   Hemoglobin 12.1 12.0 - 15.0 g/dL   HCT 35.8 (L) 36.0 - 46.0 %  MCV 90.2 80.0 - 100.0 fL   MCH 30.5 26.0 - 34.0 pg   MCHC 33.8 30.0 - 36.0 g/dL   RDW 14.2 11.5 - 15.5 %   Platelets 215 150 - 400 K/uL   nRBC 0.0 0.0 - 0.2 %   Neutrophils Relative % 69 %   Neutro Abs 4.5 1.7 - 7.7 K/uL   Lymphocytes Relative 23 %   Lymphs Abs 1.5 0.7 - 4.0 K/uL   Monocytes Relative 6 %   Monocytes Absolute 0.4 0.1 - 1.0 K/uL   Eosinophils Relative 1 %   Eosinophils Absolute 0.1 0.0 - 0.5 K/uL   Basophils Relative 1 %   Basophils Absolute 0.0 0.0 - 0.1 K/uL   Immature Granulocytes 0 %   Abs Immature Granulocytes 0.01 0.00 - 0.07 K/uL  Protein / creatinine ratio, urine     Status: None   Collection Time: 02/10/21  1:04 AM  Result Value Ref Range   Creatinine, Urine 53.11 mg/dL   Total Protein, Urine <6 mg/dL   Protein Creatinine Ratio        0.00 - 0.15 mg/mg[Cre]  Type and screen [facility]     Status: None   Collection Time: 02/10/21  1:15 AM  Result Value Ref Range   ABO/RH(D) A POS    Antibody Screen NEG    Sample Expiration      02/13/2021,2359 Performed at [facility], [place] [address] [place], [place] [address]   APTT     Status: None   Collection Time: 02/10/21  2:43 AM  Result Value Ref Range   aPTT 30 24 - 36 seconds  Protime-INR     Status: None   Collection Time: 02/10/21  2:43 AM  Result Value Ref Range   Prothrombin Time 11.6 11.4 - 15.2 seconds   INR 0.9 0.8 - 1.2  Lactate dehydrogenase     Status: None   Collection Time: 02/10/21  2:43 AM  Result Value Ref Range   LDH 155 98 - 192 U/L  Uric acid     Status: None   Collection Time: 02/10/21  2:43 AM  Result Value Ref Range   Uric Acid, Serum 6.8 2.5 - 7.1  mg/dL   Assessment:  35 y.o. G1P0 at [redacted]w[redacted]d marginal placental abruption  1. Induction of labor due to marginal placental abruption 2. FHR category I 3. GBS negative 4. Plans unmedicated labor, open to epidural 5. Plans to breastfeed 6. Rubella non-immune 7. Anxiety, stable and no current medications  Plan:  1. Admit to BS 2. Routine L&D orders 3. Analgesia/anesthesia PRN  4. PEC labs WNL, diastolic BP elevated 863-149F will continue to monitor closely 5. Send placenta to pathology  6. Will start Pitocin 2x2, consider AROM in active labor 7. Offer MMR postpartum 8. Close PP F/U for mood 9. Anticipate NSVB  Dr. FPamala Hurrynotified of admission/plan of care.  ASuzan NailerCNM, MSN 02/10/2021, 4:10 AM

## 2021-02-10 NOTE — MAU Note (Signed)
Pt continues to deny abdominal or uterine pain or discomfort. States she feels "her braxton hicks": contractions. Edwin Cap charge RN called with report and order for admission.

## 2021-02-10 NOTE — MAU Note (Signed)
Plan of care discussed with pt and with orders received from Dorisann Frames CNM. Both acceptable with plan.

## 2021-02-10 NOTE — Lactation Note (Signed)
This note was copied from a baby's chart. Lactation Consultation Note  Patient Name: Megan Oconnor IONGE'X Date: 02/10/2021 Reason for consult: L&D Initial assessment;Primapara Age:36 hours  Mom is a P1 who reports + breast changes w/pregnancy. Infant latches with ease. Mom just needed some verbal cueing to assist with improving latch. Hand expression was taught to Mom (with some resulting colostrum seen), but she may need review at a later time.   Lurline Hare [facility] 02/10/2021, 9:42 AM

## 2021-02-10 NOTE — Progress Notes (Signed)
S: Feeling strong contractions for the last 45 minutes. Desires SVE. Wife, Megan Oconnor, at the bedside providing support.   O: Vitals:   02/10/21 0504 02/10/21 0548 02/10/21 0601 02/10/21 0631  BP: (!) 125/94 (!) 143/92 114/87 137/88  Pulse: 66 (!) 58 64 (!) 57  Resp: 18 18 18 18   Temp:    97.8 F (36.6 C)  TempSrc:    Oral  SpO2:      Weight:      Height:       FHT:  FHR: 130 bpm, variability: moderate,  accelerations:  Present,  decelerations:  Absent UC:   regular, every 2 minutes SVE:   Dilation: 6 Effacement (%): 100 Station: -2 Exam by:: A Chessa Barrasso CNM   BBOW  A / P: Induction of labor due to abruptio placentae, progressing well on pitocin  Fetal Wellbeing:  Category I Pain Control:  Labor support without medications Anticipated MOD:  NSVD   Dr. [phone] updated on patient status and plan of care.   Ernestina Penna, CNM, MSN 02/10/2021, 7:55 AM

## 2021-02-10 NOTE — Lactation Note (Signed)
This note was copied from a baby's chart. Lactation Consultation Note  Patient Name: Megan Oconnor Date: 02/10/2021 Reason for consult: Mother's request;Primapara;1st time breastfeeding;Early term 37-38.6wks;Initial assessment Age: 36 hrs Infant just fed 1 hr prior to arrival for 30 minutes.   Mom denied any pain with the latch. Her nipples are erect with no signs of trauma.   LC set up manual pump and reviewed pump parts, assembly, cleaning and storage. Mom did not want to try the pump to assess flange size at the time of the visit.   Plan 1. To feed based on cues 8-12x in 24 hrs no more than 3 hrs without an attempt. Mom to offer both breasts and look for swallows.          2. Mom pre pump for 5-10 minutes to increase let down.           3. I and O sheet reviewed.           4. LC brochure reviewed.   Maternal Data Has patient been taught Hand Expression?: Yes Does the patient have breastfeeding experience prior to this delivery?: No  Feeding Mother's Current Feeding Choice: Breast Milk  LATCH Score                    Lactation Tools Discussed/Used Tools: Pump Breast pump type: Manual Pump Education: Setup, frequency, and cleaning;Milk Storage Reason for Pumping: increase stimulation Pumping frequency: every 3 hrs for 15 minutes  Interventions Interventions: Breast feeding basics reviewed;Support pillows;Education;Position options;Skin to skin;Expressed milk;Hand express;Hand pump;Breast compression  Discharge Pump: Personal WIC Program: No  Consult Status Consult Status: Follow-up Date: 02/11/21 Follow-up type: In-patient    Carrell Palmatier  [place] 02/10/2021, 5:56 PM

## 2021-02-11 LAB — CBC
HCT: 32.7 % — ABNORMAL LOW (ref 36.0–46.0)
Hemoglobin: 10.8 g/dL — ABNORMAL LOW (ref 12.0–15.0)
MCH: 29.8 pg (ref 26.0–34.0)
MCHC: 33 g/dL (ref 30.0–36.0)
MCV: 90.1 fL (ref 80.0–100.0)
Platelets: 191 10*3/uL (ref 150–400)
RBC: 3.63 MIL/uL — ABNORMAL LOW (ref 3.87–5.11)
RDW: 14.2 % (ref 11.5–15.5)
WBC: 8.5 10*3/uL (ref 4.0–10.5)
nRBC: 0 % (ref 0.0–0.2)

## 2021-02-11 MED ORDER — COCONUT OIL OIL: 1.0000 "application " | TOPICAL_OIL | 0 refills | Status: DC | PRN

## 2021-02-11 MED ORDER — MEASLES, MUMPS & RUBELLA VAC IJ SOLR
0.5000 mL | Freq: Once | INTRAMUSCULAR | Status: AC
  Administered 2021-02-11: 0.5 mL via SUBCUTANEOUS
  Filled 2021-02-11: qty 0.5

## 2021-02-11 MED ORDER — IBUPROFEN 600 MG PO TABS: 600.0000 mg | ORAL_TABLET | Freq: Four times a day (QID) | ORAL | 0 refills | Status: DC

## 2021-02-11 MED ORDER — BENZOCAINE-MENTHOL 20-0.5 % EX AERO: 1.0000 "application " | INHALATION_SPRAY | CUTANEOUS | Status: DC | PRN

## 2021-02-11 MED ORDER — ACETAMINOPHEN 500 MG PO TABS: 1000.0000 mg | ORAL_TABLET | Freq: Four times a day (QID) | ORAL | 2 refills | Status: AC | PRN

## 2021-02-11 NOTE — Discharge Instructions (Signed)
Lactation outpatient support - home visit  Linda Coppola RN, MHA, IBCLC at [facility]: Lactation Consultant  [email or web address] Mail: [email or web address] Tel: [phone]    Additional resources:  [facility] [email or web address]   Chiropractic specialist   [email or web address] Leanna Hastings [email or web address]  Craniosacral therapy for baby  Erin Balkind  [email or web address]  

## 2021-02-11 NOTE — Discharge Summary (Incomplete)
OB Discharge Summary  Patient Name: Megan Oconnor DOB: [birth year] MRN: [id number]  Date of admission: 02/09/2021 Delivering provider: Dorisann Frames K   Admitting diagnosis: Encounter for induction of labor [Z34.90] Intrauterine pregnancy: [redacted]w[redacted]d     Secondary diagnosis: Patient Active Problem List   Diagnosis Date Noted  . Encounter for induction of labor 02/10/2021  . Marginal placental abruption 02/10/2021  . SVD 3/28 02/10/2021  . Postpartum care following vaginal delivery 3/28 02/10/2021  . Laceration of vaginal wall or sulcus without perineal laceration during delivery 02/10/2021     Date of discharge: 02/11/2021   Discharge diagnosis: Principal Problem:   Postpartum care following vaginal delivery 3/28 Active Problems:   Encounter for induction of labor   Marginal placental abruption   SVD 3/28   Laceration of vaginal wall or sulcus without perineal laceration during delivery                                                              Post partum procedures:{Postpartum procedures:23558}  Augmentation: {Augmentation:20782} Pain control: Local  Laceration:Sulcus  Episiotomy:None  Complications: {OB Labor/Delivery Complications:20784}  Hospital course:  {Courses:23701}  Physical exam  Vitals:   02/11/21 0015 02/11/21 0128 02/11/21 0500 02/11/21 1947  BP:  111/74 (!) 129/92 (!) 115/91  Pulse: 65 62 65 68  Resp: 18 16 17 18   Temp: 98.6 F (37 C) 98.6 F (37 C) 98.6 F (37 C) 98.2 F (36.8 C)  TempSrc: Oral Oral Oral Oral  SpO2:  100% 100%   Weight:      Height:       General: {Exam; general:21111117} Lochia: {Desc; appropriate/inappropriate:30686::"appropriate"} Uterine Fundus: {Desc; firm/soft:30687} Incision: {Exam; incision:21111123} Perineum: repair ***, *** edema DVT Evaluation: {Exam; dvt:2111122} Labs: Lab Results  Component Value Date   WBC 8.5 02/11/2021   HGB 10.8 (L) 02/11/2021   HCT 32.7 (L) 02/11/2021   MCV 90.1 02/11/2021   PLT 191  02/11/2021   CMP Latest Ref Rng & Units 02/10/2021  Glucose 70 - 99 mg/dL 02/12/2021)  BUN 6 - 20 mg/dL 13  Creatinine 456(Y - 5.63 mg/dL 8.93  Sodium 7.34 - 287 mmol/L 133(L)  Potassium 3.5 - 5.1 mmol/L 3.3(L)  Chloride 98 - 111 mmol/L 107  CO2 22 - 32 mmol/L 20(L)  Calcium 8.9 - 10.3 mg/dL 8.9  Total Protein 6.5 - 8.1 g/dL 6.0(L)  Total Bilirubin 0.3 - 1.2 mg/dL 0.5  Alkaline Phos 38 - 126 U/L 173(H)  AST 15 - 41 U/L 26  ALT 0 - 44 U/L 17   Edinburgh Postnatal Depression Scale Screening Tool 02/11/2021 02/11/2021 02/10/2021  I have been able to laugh and see the funny side of things. 0 1 (No Data)  I have looked forward with enjoyment to things. 0 1 -  I have blamed myself unnecessarily when things went wrong. 0 1 -  I have been anxious or worried for no good reason. 1 3 -  I have felt scared or panicky for no good reason. 0 1 -  Things have been getting on top of me. 0 1 -  I have been so unhappy that I have had difficulty sleeping. 0 2 -  I have felt sad or miserable. 0 1 -  I have been so unhappy that I have been crying. 0  1 -  The thought of harming myself has occurred to me. 0 1 -  Edinburgh Postnatal Depression Scale Total 1 13 -    Vaccines: TDaP ***         Flu    ***         COVID-19   ***  Discharge instructions:  per After Visit Summary and [facility] booklet  After Visit Meds:  Allergies as of 02/11/2021   No Known Allergies     Medication List    STOP taking these medications   valACYclovir 1000 MG tablet Commonly known as: VALTREX     TAKE these medications   acetaminophen 500 MG tablet Commonly known as: TYLENOL Take 2 tablets (1,000 mg total) by mouth every 6 (six) hours as needed.   benzocaine-Menthol 20-0.5 % Aero Commonly known as: DERMOPLAST Apply 1 application topically as needed for irritation (perineal discomfort).   calcium carbonate 500 MG chewable tablet Commonly known as: TUMS - dosed in mg elemental calcium Chew 1 tablet by mouth 3  (three) times daily as needed for indigestion or heartburn.   coconut oil Oil Apply 1 application topically as needed.   DHA PO Take 1 capsule by mouth daily.   ibuprofen 600 MG tablet Commonly known as: ADVIL Take 1 tablet (600 mg total) by mouth every 6 (six) hours. Start taking on: February 12, 2021   MAGNESIUM CARBONATE PO Take 1 each by mouth at bedtime.   prenatal multivitamin Tabs tablet Take 1 tablet by mouth daily at 12 noon.   Zinc 40 MG Tabs Take 1 tablet by mouth 3 (three) times a week.       Diet: {OB diet:21111121}  Activity: Advance as tolerated. Pelvic rest for 6 weeks.   Newborn Data: Live born female  Birth Weight: 6 lb 3.8 oz (2829 g) APGAR: 9, 9  Newborn Delivery   Birth date/time: 02/10/2021 08:20:00 Delivery type: Vaginal, Spontaneous       Named *** Baby Feeding: {Baby feeding:23562} Disposition:{CHL IP OB HOME WITH VQQVZD:63875}  Delivery Report:  Review the Delivery Report for details.    Follow up:  Follow-up Information    June Leap, CNM. Schedule an appointment as soon as possible for a visit in 6 week(s).   Specialty: Certified Nurse Midwife Why: Please make an appointment for 6 weeks postpartum.  [facility] home visit for BP check in 1 week. Contact information: [address] [place] [address] [phone]               Clancy Gourd, MSN 02/11/2021, 9:26 PM

## 2021-02-11 NOTE — Progress Notes (Incomplete)
Post Partum Day #2  S/P *** Live born female  Birth Weight: 6 lb 3.8 oz (2829 g) APGAR: 9, 9  Newborn Delivery   Birth date/time: 02/10/2021 08:20:00 Delivery type: Vaginal, Spontaneous     Named *** Delivering provider: Dorisann Frames K   circumcision *** Feeding: {feeding:120017}  Pain control at delivery: Local   Subjective: No HA, SOB, CP, breast symptoms.  Pain ***.  Normal vaginal bleeding, no clots.   Voiding freely.    Objective:  VS:  Vitals:   02/10/21 2155 02/11/21 0015 02/11/21 0128 02/11/21 0500  BP: 133/83  111/74 (!) 129/92  Pulse: 80 65 62 65  Resp: 18 18 16 17   Temp: 98.3 F (36.8 C) 98.6 F (37 C) 98.6 F (37 C) 98.6 F (37 C)  TempSrc: Oral Oral Oral Oral  SpO2: 100%  100% 100%  Weight:      Height:        No intake or output data in the 24 hours ending 02/11/21 0831    Recent Labs    02/10/21 0102 02/11/21 0448  WBC 6.4 8.5  HGB 12.1 10.8*  HCT 35.8* 32.7*  PLT 215 191    Blood type: --/--/A POS (03/28 0115) Rubella: Nonimmune (10/27 0000)  Vaccines: TDaP          ***         Flu             ***                    COVID-19 ***  Physical Exam:  General: AAO x 3, NAD Uterine Fundus: {Desc; firm/soft:30687} Lochia: appropriate Perineum: repair ***, *** edema DVT Evaluation: {Exam; dvt:13533}    Assessment/Plan: PPD # 2 / 35 y.o., G1P1001    Principal Problem:   Postpartum care following vaginal delivery 3/28 Active Problems:   Encounter for induction of labor   Marginal placental abruption   SVD 3/28   Laceration of vaginal wall or sulcus without perineal laceration during delivery    {assessment postpartum obgyn:313104::"normal postpartum exam"}  Continue current postpartum care             DC home today w/ instructions  F/U at [facility] in 6 weeks and PRN   LOS: 1 day

## 2021-02-11 NOTE — Lactation Note (Signed)
This note was copied from a baby's chart. Lactation Consultation Note  Patient Name: Megan Oconnor [id number]'D Date: 02/11/2021 Reason for consult: Follow-up assessment;Primapara;1st time breastfeeding;Hyperbilirubinemia;Early term 37-38.6wks Age:36 hours  Follow up visit to 35 hours old with 3.85% weight loss at the time of consult. Baby is awake in mother's arms upon arrival. Mother states breastfeeding is going well but infant is not getting full.INfant seems to be cluster-feeding. Infant has been been having good voids and stools, per mother.  Reinforced attention to hunger and fullness cues as well as making feeding effective by keeping infant awake. Discussed using EBM as dessert to top infant up.   Feeding plan:  1. Breastfeed following hunger cues or 8 - 12 times in 24h period 2. Stimulate infant awake at the breast by massaging shoulders, arms, feet, etc 3. Hand express or pump as needed for supplementation 4. If needed supplement with EBM following guidelines, pace bottle feeding, frequent burping and upright position. 5. Encouraged maternal rest, hydration and food intake.  6. Contact Lactation Services or local resources for support, questions or concerns.    All questions answered at this time. Family is waiting to be discharged home today.  Maternal Data Has patient been taught Hand Expression?: Yes Does the patient have breastfeeding experience prior to this delivery?: No  Feeding Mother's Current Feeding Choice: Breast Milk  LATCH Score Latch: Grasps breast easily, tongue down, lips flanged, rhythmical sucking.  Audible Swallowing: Spontaneous and intermittent  Type of Nipple: Everted at rest and after stimulation  Comfort (Breast/Nipple): Soft / non-tender  Hold (Positioning): No assistance needed to correctly position infant at breast.  LATCH Score: 10  Interventions Interventions: Breast feeding basics reviewed;Education;Hand express;Breast massage;Skin to  skin;Expressed milk  Discharge Discharge Education: Engorgement and breast care;Warning signs for feeding baby Pump: Personal WIC Program: No  Consult Status Consult Status: Complete Date: 02/11/21 Follow-up type: Call as needed    Megan Oconnor 02/11/2021, 8:14 PM

## 2021-02-11 NOTE — Lactation Note (Addendum)
This note was copied from a baby's chart. Lactation Consultation Note  Patient Name: Megan Oconnor [id number] Date: 02/11/2021 Reason for consult: Follow-up assessment;Early term 37-38.6wks;Primapara;Infant weight loss;Other (Comment) (4 % weight loss, repeat Bilirubin at 4pm . mom sound asleep, support person holding baby and she mentioned the baby had recently fed 1315 and fed well. LC mentioned to the [facility] LC will need to see prior to D/C if she is D/C.) Age:83 hours  Considered a late D/C due to repeat Bili order.   Maternal Data    Feeding Mother's Current Feeding Choice: Breast Milk  LATCH Score - fed for 50 mins at 1315 .  Latch scores have been 8's and 1- 9 .                     Lactation Tools Discussed/Used    Interventions    Discharge    Consult Status Consult Status: Follow-up Date: 02/11/21 Follow-up type: In-patient    Megan Oconnor 02/11/2021, 2:38 PM

## 2021-02-11 NOTE — Social Work (Addendum)
CSW received consult for hx of Anxiety.  CSW met with [name] to offer support and complete assessment.    CSW met with [name] and her spouse Megan Oconnor at bedside. CSW congratulated [name] and her spouse Megan Oconnor. CSW introduced role. CSW offered [name] privacy. [name] agreeable for her spouse to stay. CSW observed [name] holding and bonding with the infant.  CSW asked [name] how she feels. [name] reports, " We are tired but good." [name] reports I don't feel out to the norm. CSW asked [name] what is her norm. [name] reports, "GAD (generalized anxiety) and worry, worry about if the baby is eating and pooping okay."  [name] reports her pregnancy was emotionally hard because she was anxious and worried throughout the pregnancy. [name] reports she was worried how she would feel emotionally after giving birth. [name] reports she was concern about being depressed. [name] reports she is feeling the opposite, since giving birth she has felt happy and optimistic. CSW asked  [name] if she has received treatment for anxiety. [name] reports she has not received any therapy or medications for her anxiety.   CSW provided education regarding the baby blues period vs. perinatal mood disorder and discussed treatment and gave resources for mental health follow up if concerns arise. [name] receptive to resources. CSW recommended [name] complete a self-evaluation during the postpartum time period using the New Mom Checklist from Postpartum Progress and encouraged [name] to contact a medical professional if symptoms are noted at any time. [name] agreeable to use the check list. CSW asked [name] about her supports. [name] reports her spouse Megan Oconnor (in the area).     CSW provided review of Sudden Infant Death Syndrome (SIDS) precautions and informed no-co sleeping with the infant. CSW asked [name] about items for the infant. [name] reports she has all items including bassinet and car seat. CSW assessed for additional needs. [name] reports no additional needs.    CSW identifies no further need for intervention and no  barriers to discharge at this time.   Megan Oconnor, MSW, LCSW [facility]  Clinical Social Worker  [phone] 02/11/2021  1:29 PM 

## 2021-02-11 NOTE — Lactation Note (Signed)
This note was copied from a baby's chart. Lactation Consultation Note  Patient Name: Girl Jermisha Hoffart [id number] Date: 02/11/2021 Reason for consult: Follow-up assessment;Primapara;Early term 37-38.6wks Age:36 hours Mom awake but infant asleep in crib on arrival. Mom reports she feels like breastfeeding is going well.  Nipples sore but feels intact.   Urged to feed on cue and at least 8-12 times day. Infant has had 1 void and 1 stool thus far.  Reviewed cluster feeding and baby's second night.  Urged hand expression and rubbing expressed mothers milk on nipples and air dry.   Urged mom to call lactation as needed. Maternal Data    Feeding Mother's Current Feeding Choice: Breast Milk  LATCH Score                    Lactation Tools Discussed/Used    Interventions    Discharge    Consult Status Consult Status: Follow-up Date: 02/12/21 Follow-up type: In-patient    [facility] Michaelle Copas 02/11/2021, 5:43 AM

## 2021-02-11 NOTE — Discharge Summary (Signed)
OB Discharge Summary  Patient Name: Megan Oconnor DOB: Jun 05, 1986 MRN: [id number]  Date of admission: 02/09/2021 Delivering provider: Dorisann Frames K   Admitting diagnosis: Encounter for induction of labor [Z34.90] Intrauterine pregnancy: [redacted]w[redacted]d     Secondary diagnosis: Patient Active Problem List   Diagnosis Date Noted  . Encounter for induction of labor 02/10/2021  . Marginal placental abruption 02/10/2021  . SVD 3/28 02/10/2021  . Postpartum care following vaginal delivery 3/28 02/10/2021  . Laceration of vaginal wall or sulcus without perineal laceration during delivery 02/10/2021   Additional problems:none   Date of discharge: 02/11/2021   Discharge diagnosis: Principal Problem:   Postpartum care following vaginal delivery 3/28 Active Problems:   Encounter for induction of labor   Marginal placental abruption   SVD 3/28   Laceration of vaginal wall or sulcus without perineal laceration during delivery                                                              Post partum procedures:none  Augmentation: Pitocin Pain control: Local  Laceration:Sulcus  Episiotomy:None  Complications: Placental Abruption  / [facility] course:  Induction of Labor With Vaginal Delivery   35 y.o. yo G1P1001 at [redacted]w[redacted]d was admitted to the hospital 02/09/2021 for induction of labor.  Indication for induction: decreased fetal movement and suspect partial abruption.  Patient had an uncomplicated labor course as follows: Membrane Rupture Time/Date: 8:15 AM ,02/10/2021   Delivery Method:Vaginal, Spontaneous  Episiotomy: None  Lacerations:  Sulcus  Details of delivery can be found in separate delivery note.  Patient had a routine postpartum course. Patient is discharged home 02/12/21.  Newborn Data: Birth date:02/10/2021  Birth time:8:20 AM  Gender:Female  Living status:Living  Apgars:9 ,9  Weight:2829 g   Physical exam  Vitals:   02/11/21 0015 02/11/21 0128 02/11/21 0500 02/11/21 1947   BP:  111/74 (!) 129/92 (!) 115/91  Pulse: 65 62 65 68  Resp: 18 16 17 18   Temp: 98.6 F (37 C) 98.6 F (37 C) 98.6 F (37 C) 98.2 F (36.8 C)  TempSrc: Oral Oral Oral Oral  SpO2:  100% 100%   Weight:      Height:       General: alert, cooperative and no distress Lochia: appropriate Uterine Fundus: firm Incision: N/A Perineum: repair intact, no edema DVT Evaluation: No cords or calf tenderness. No significant calf/ankle edema. Labs: Lab Results  Component Value Date   WBC 8.5 02/11/2021   HGB 10.8 (L) 02/11/2021   HCT 32.7 (L) 02/11/2021   MCV 90.1 02/11/2021   PLT 191 02/11/2021   CMP Latest Ref Rng & Units 02/10/2021  Glucose 70 - 99 mg/dL 02/12/2021)  BUN 6 - 20 mg/dL 13  Creatinine 092(Z - 3.00 mg/dL 7.62  Sodium 2.63 - 335 mmol/L 133(L)  Potassium 3.5 - 5.1 mmol/L 3.3(L)  Chloride 98 - 111 mmol/L 107  CO2 22 - 32 mmol/L 20(L)  Calcium 8.9 - 10.3 mg/dL 8.9  Total Protein 6.5 - 8.1 g/dL 6.0(L)  Total Bilirubin 0.3 - 1.2 mg/dL 0.5  Alkaline Phos 38 - 126 U/L 173(H)  AST 15 - 41 U/L 26  ALT 0 - 44 U/L 17   Edinburgh Postnatal Depression Scale Screening Tool 02/11/2021 02/11/2021 02/10/2021  I have been able  to laugh and see the funny side of things. 0 1 (No Data)  I have looked forward with enjoyment to things. 0 1 -  I have blamed myself unnecessarily when things went wrong. 0 1 -  I have been anxious or worried for no good reason. 1 3 -  I have felt scared or panicky for no good reason. 0 1 -  Things have been getting on top of me. 0 1 -  I have been so unhappy that I have had difficulty sleeping. 0 2 -  I have felt sad or miserable. 0 1 -  I have been so unhappy that I have been crying. 0 1 -  The thought of harming myself has occurred to me. 0 1 -  Edinburgh Postnatal Depression Scale Total 1 13 -   Vaccines: TDaP          UTD         Flu             UTD                    COVID-19 UTD  Discharge instruction:  per After Visit Summary,  [facility] booklet and   "[facility]" hospital booklet  After Visit Meds:  Allergies as of 02/11/2021   No Known Allergies     Medication List    STOP taking these medications   valACYclovir 1000 MG tablet Commonly known as: VALTREX     TAKE these medications   acetaminophen 500 MG tablet Commonly known as: TYLENOL Take 2 tablets (1,000 mg total) by mouth every 6 (six) hours as needed.   benzocaine-Menthol 20-0.5 % Aero Commonly known as: DERMOPLAST Apply 1 application topically as needed for irritation (perineal discomfort).   calcium carbonate 500 MG chewable tablet Commonly known as: TUMS - dosed in mg elemental calcium Chew 1 tablet by mouth 3 (three) times daily as needed for indigestion or heartburn.   coconut oil Oil Apply 1 application topically as needed.   DHA PO Take 1 capsule by mouth daily.   ibuprofen 600 MG tablet Commonly known as: ADVIL Take 1 tablet (600 mg total) by mouth every 6 (six) hours. Start taking on: February 12, 2021   MAGNESIUM CARBONATE PO Take 1 each by mouth at bedtime.   prenatal multivitamin Tabs tablet Take 1 tablet by mouth daily at 12 noon.   Zinc 40 MG Tabs Take 1 tablet by mouth 3 (three) times a week.       Diet: routine diet  Activity: Advance as tolerated. Pelvic rest for 6 weeks.   Postpartum contraception: Not Discussed  Newborn Data: Live born female  Birth Weight: 6 lb 3.8 oz (2829 g) APGAR: 9, 9  Newborn Delivery   Birth date/time: 02/10/2021 08:20:00 Delivery type: Vaginal, Spontaneous      named [place] Baby Feeding: Breast Disposition:home with mother   Delivery Report:  Review the Delivery Report for details.    Follow up:  Follow-up Information    June Leap, CNM. Schedule an appointment as soon as possible for a visit in 6 week(s).   Specialty: Certified Nurse Midwife Why: Please make an appointment for 6 weeks postpartum.  [facility] home visit for BP check in 1 week. Contact  information: [address] [place] [address] [phone]                 Signed: Cipriano Mile, MSN 02/11/2021, 9:07 PM

## 2021-02-11 NOTE — Progress Notes (Signed)
PPD # 1 S/P NSVD  Live born female  Birth Weight: 6 lb 3.8 oz (2829 g) APGAR: 9, 9  Newborn Delivery   Birth date/time: 02/10/2021 08:20:00 Delivery type: Vaginal, Spontaneous     Baby name: Megan Oconnor Delivering provider: Dorisann Frames K  Episiotomy:None   Lacerations:Sulcus   Feeding: breast  Pain control at delivery: Local   S:  Reports feeling well, unsure about DC until baby has been cleared by peds.             Tolerating po/ No nausea or vomiting             Bleeding is light             Pain controlled with PO meds             Up ad lib / ambulatory / voiding without difficulties   O:  A & O x 3, in no apparent distress              VS:  Vitals:   02/10/21 2155 02/11/21 0015 02/11/21 0128 02/11/21 0500  BP: 133/83  111/74 (!) 129/92  Pulse: 80 65 62 65  Resp: 18 18 16 17   Temp: 98.3 F (36.8 C) 98.6 F (37 C) 98.6 F (37 C) 98.6 F (37 C)  TempSrc: Oral Oral Oral Oral  SpO2: 100%  100% 100%  Weight:      Height:        LABS:  Recent Labs    02/10/21 0102 02/11/21 0448  WBC 6.4 8.5  HGB 12.1 10.8*  HCT 35.8* 32.7*  PLT 215 191    Blood type: --/--/A POS (03/28 0115)  Rubella: Nonimmune (10/27 0000)   I&O: I/O last 3 completed shifts: In: -  Out: 100 [Blood:100]          No intake/output data recorded.  Vaccines:  TDaP         UTD Flu             UTD COVID-19 UTD   Gen: AAO x 3, NAD  Abdomen: soft, non-tender, non-distended             Fundus: firm, non-tender, U-1  Perineum: repair intact, no edema  Lochia: small  Extremities: no edema, no calf pain or tenderness    A/P: PPD # 1 34 y.o., G1P1001   Principal Problem:   Postpartum care following vaginal delivery 3/28 Active Problems:   Encounter for induction of labor   Marginal placental abruption   SVD 3/28   Laceration of vaginal wall or sulcus without perineal laceration during delivery   Doing well - stable status  Routine post partum orders  Anticipate discharge tonight or  tomorrow    4/28, MSN, CNM 02/11/2021, 9:13 AM

## 2021-02-11 NOTE — Progress Notes (Signed)
Pt states that her Inocente Salles Postnatal Depression Scale was filled out inappropriately because she had several things going on at the time and her partner was helping her fill it out.  Pt filled it out herself and actually got a score of 1, so social services is not needed. Pt states that she did see a Child psychotherapist this morning.

## 2021-02-12 DIAGNOSIS — Z00110 Health examination for newborn under 8 days old: Secondary | ICD-10-CM | POA: Diagnosis not present

## 2021-02-12 LAB — SURGICAL PATHOLOGY

## 2021-02-17 ENCOUNTER — Telehealth: Payer: Self-pay | Admitting: Family Medicine

## 2021-02-17 NOTE — Telephone Encounter (Signed)
Requested records received from [facility]

## 2021-03-20 ENCOUNTER — Other Ambulatory Visit: Payer: Self-pay

## 2021-03-21 ENCOUNTER — Encounter: Payer: Self-pay | Admitting: Internal Medicine

## 2021-03-21 ENCOUNTER — Ambulatory Visit (INDEPENDENT_AMBULATORY_CARE_PROVIDER_SITE_OTHER): Payer: BC Managed Care – PPO | Admitting: Internal Medicine

## 2021-03-21 VITALS — BP 110/70 | HR 46 | Temp 98.1°F | Ht 69.0 in | Wt 151.8 lb

## 2021-03-21 DIAGNOSIS — Z0000 Encounter for general adult medical examination without abnormal findings: Secondary | ICD-10-CM

## 2021-03-21 DIAGNOSIS — L720 Epidermal cyst: Secondary | ICD-10-CM | POA: Diagnosis not present

## 2021-03-21 NOTE — Progress Notes (Signed)
Established Patient Office Visit     This visit occurred during the SARS-CoV-2 public health emergency.  Safety protocols were in place, including screening questions prior to the visit, additional usage of staff PPE, and extensive cleaning of exam room while observing appropriate contact time as indicated for disinfecting solutions.    CC/Reason for Visit: Establish care  HPI: Megan Oconnor is a 35 y.o. female who is coming in today for the above mentioned reasons.  She has no past medical history of significance.  She delivered a healthy infant towards the end of March.  She is currently breast-feeding.  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  There is some alcoholism in her mother's side of the family, she recently moved from [place].  She is married to a same-sex partner.  She has no known drug allergies.  She has a history of IBS that is currently well controlled with diet modifications.  Her only concern today is a small hyperpigmented, cystlike lesion over her right middle finger.  Past Medical/Surgical History: Past Medical History:  Diagnosis Date  . Anxiety   . Restless leg syndrome     Past Surgical History:  Procedure Laterality Date  . WISDOM TOOTH EXTRACTION      Social History:  reports that she has never smoked. She has never used smokeless tobacco. She reports previous alcohol use. She reports that she does not use drugs.  Allergies: Allergies  Allergen Reactions  . Penicillins     Family History:  Alcoholism in mother and maternal grandfather  Current Outpatient Medications:  .  acetaminophen (TYLENOL) 500 MG tablet, Take 2 tablets (1,000 mg total) by mouth every 6 (six) hours as needed., Disp: 100 tablet, Rfl: 2 .  MAGNESIUM CARBONATE PO, Take 1 each by mouth at bedtime., Disp: , Rfl:  .  Prenatal Vit-Fe Fumarate-FA (PRENATAL MULTIVITAMIN) TABS tablet, Take 1 tablet by mouth daily at 12 noon., Disp: , Rfl:  .  valACYclovir  (VALTREX) 1000 MG tablet, SMARTSIG:2 Tablet(s) By Mouth Every 12 Hours, Disp: , Rfl:  .  Zinc 40 MG TABS, Take 1 tablet by mouth 3 (three) times a week., Disp: , Rfl:   Review of Systems:  Constitutional: Denies fever, chills, diaphoresis, appetite change and fatigue.  HEENT: Denies photophobia, eye pain, redness, hearing loss, ear pain, congestion, sore throat, rhinorrhea, sneezing, mouth sores, trouble swallowing, neck pain, neck stiffness and tinnitus.   Respiratory: Denies SOB, DOE, cough, chest tightness,  and wheezing.   Cardiovascular: Denies chest pain, palpitations and leg swelling.  Gastrointestinal: Denies nausea, vomiting, abdominal pain, diarrhea, constipation, blood in stool and abdominal distention.  Genitourinary: Denies dysuria, urgency, frequency, hematuria, flank pain and difficulty urinating.  Endocrine: Denies: hot or cold intolerance, sweats, changes in hair or nails, polyuria, polydipsia. Musculoskeletal: Denies myalgias, back pain, joint swelling, arthralgias and gait problem.  Skin: Denies pallor, rash and wound.  Neurological: Denies dizziness, seizures, syncope, weakness, light-headedness, numbness and headaches.  Hematological: Denies adenopathy. Easy bruising, personal or family bleeding history  Psychiatric/Behavioral: Denies suicidal ideation, mood changes, confusion, nervousness, sleep disturbance and agitation    Physical Exam: Vitals:   03/21/21 0750  BP: 110/70  Pulse: (!) 46  Temp: 98.1 F (36.7 C)  TempSrc: Oral  SpO2: 98%  Weight: 151 lb 12.8 oz (68.9 kg)  Height: 5' 9"  (1.753 m)    Body mass index is 22.42 kg/m.   Constitutional: NAD, calm, comfortable Eyes: PERRL, lids and conjunctivae normal ENMT: Mucous  membranes are moist.  Neck: normal, supple, no masses, no thyromegaly Respiratory: clear to auscultation bilaterally, no wheezing, no crackles. Normal respiratory effort. No accessory muscle use.  Cardiovascular: Regular rate and  rhythm, no murmurs / rubs / gallops. No extremity edema. 2+ pedal pulses. No carotid bruits.  Abdomen: no tenderness, no masses palpated. No hepatosplenomegaly. Bowel sounds positive.  Musculoskeletal: no clubbing / cyanosis. No joint deformity upper and lower extremities. Good ROM, no contractures. Normal muscle tone.  Skin: 1 mm hyperpigmented, round lesion on her right middle finger. Neurologic: Grossly intact and nonfocal Psychiatric: Normal judgment and insight. Alert and oriented x 3. Normal mood.    Impression and Plan:  Encounter for preventive health examination -Have advised routine eye and dental care. -She has had 2 COVID vaccines and will be getting her booster soon, otherwise immunizations are up-to-date and age-appropriate. -Screening labs not required. -Healthy lifestyle discussed in detail. -Commence routine breast cancer screening age 18 and colon cancer screening age 78. -She has her 6-week postpartum visit coming up soon with her GYN.  Epidermoid cyst of finger of right hand -Very small, not concerning at this time, no further work-up.    Patient Instructions   -Nice seeing you today!!  -Schedule follow up in 1 year or sooner as needed.   Preventive Care 67-62 Years Old, Female Preventive care refers to lifestyle choices and visits with your health care provider that can promote health and wellness. This includes:  A yearly physical exam. This is also called an annual wellness visit.  Regular dental and eye exams.  Immunizations.  Screening for certain conditions.  Healthy lifestyle choices, such as: ? Eating a healthy diet. ? Getting regular exercise. ? Not using drugs or products that contain nicotine and tobacco. ? Limiting alcohol use. What can I expect for my preventive care visit? Physical exam Your health care provider may check your:  Height and weight. These may be used to calculate your BMI (body mass index). BMI is a measurement that  tells if you are at a healthy weight.  Heart rate and blood pressure.  Body temperature.  Skin for abnormal spots. Counseling Your health care provider may ask you questions about your:  Past medical problems.  Family's medical history.  Alcohol, tobacco, and drug use.  Emotional well-being.  Home life and relationship well-being.  Sexual activity.  Diet, exercise, and sleep habits.  Work and work Statistician.  Access to firearms.  Method of birth control.  Menstrual cycle.  Pregnancy history. What immunizations do I need? Vaccines are usually given at various ages, according to a schedule. Your health care provider will recommend vaccines for you based on your age, medical history, and lifestyle or other factors, such as travel or where you work.   What tests do I need? Blood tests  Lipid and cholesterol levels. These may be checked every 5 years starting at age 79.  Hepatitis C test.  Hepatitis B test. Screening  Diabetes screening. This is done by checking your blood sugar (glucose) after you have not eaten for a while (fasting).  STD (sexually transmitted disease) testing, if you are at risk.  BRCA-related cancer screening. This may be done if you have a family history of breast, ovarian, tubal, or peritoneal cancers.  Pelvic exam and Pap test. This may be done every 3 years starting at age 53. Starting at age 26, this may be done every 5 years if you have a Pap test in combination with an HPV  test. Talk with your health care provider about your test results, treatment options, and if necessary, the need for more tests.   Follow these instructions at home: Eating and drinking  Eat a healthy diet that includes fresh fruits and vegetables, whole grains, lean protein, and low-fat dairy products.  Take vitamin and mineral supplements as recommended by your health care provider.  Do not drink alcohol if: ? Your health care provider tells you not to  drink. ? You are pregnant, may be pregnant, or are planning to become pregnant.  If you drink alcohol: ? Limit how much you have to 0-1 drink a day. ? Be aware of how much alcohol is in your drink. In the [place] one drink equals one 12 oz bottle of beer (355 mL), one 5 oz glass of wine (148 mL), or one 1 oz glass of hard liquor (44 mL).   Lifestyle  Take daily care of your teeth and gums. Brush your teeth every morning and night with fluoride toothpaste. Floss one time each day.  Stay active. Exercise for at least 30 minutes 5 or more days each week.  Do not use any products that contain nicotine or tobacco, such as cigarettes, e-cigarettes, and chewing tobacco. If you need help quitting, ask your health care provider.  Do not use drugs.  If you are sexually active, practice safe sex. Use a condom or other form of protection to prevent STIs (sexually transmitted infections).  If you do not wish to become pregnant, use a form of birth control. If you plan to become pregnant, see your health care provider for a prepregnancy visit.  Find healthy ways to cope with stress, such as: ? Meditation, yoga, or listening to music. ? Journaling. ? Talking to a trusted person. ? Spending time with friends and family. Safety  Always wear your seat belt while driving or riding in a vehicle.  Do not drive: ? If you have been drinking alcohol. Do not ride with someone who has been drinking. ? When you are tired or distracted. ? While texting.  Wear a helmet and other protective equipment during sports activities.  If you have firearms in your house, make sure you follow all gun safety procedures.  Seek help if you have been physically or sexually abused. What's next?  Go to your health care provider once a year for an annual wellness visit.  Ask your health care provider how often you should have your eyes and teeth checked.  Stay up to date on all vaccines. This information is not  intended to replace advice given to you by your health care provider. Make sure you discuss any questions you have with your health care provider. Document Revised: 06/30/2020 Document Reviewed: 07/14/2018 Elsevier Patient Education  2021 [place], [place] [facility]

## 2021-03-21 NOTE — Patient Instructions (Signed)
-Nice seeing you today!!  -Schedule follow up in 1 year or sooner as needed.   Preventive Care 59-36 Years Old, Female Preventive care refers to lifestyle choices and visits with your health care provider that can promote health and wellness. This includes:  A yearly physical exam. This is also called an annual wellness visit.  Regular dental and eye exams.  Immunizations.  Screening for certain conditions.  Healthy lifestyle choices, such as: ? Eating a healthy diet. ? Getting regular exercise. ? Not using drugs or products that contain nicotine and tobacco. ? Limiting alcohol use. What can I expect for my preventive care visit? Physical exam Your health care provider may check your:  Height and weight. These may be used to calculate your BMI (body mass index). BMI is a measurement that tells if you are at a healthy weight.  Heart rate and blood pressure.  Body temperature.  Skin for abnormal spots. Counseling Your health care provider may ask you questions about your:  Past medical problems.  Family's medical history.  Alcohol, tobacco, and drug use.  Emotional well-being.  Home life and relationship well-being.  Sexual activity.  Diet, exercise, and sleep habits.  Work and work Statistician.  Access to firearms.  Method of birth control.  Menstrual cycle.  Pregnancy history. What immunizations do I need? Vaccines are usually given at various ages, according to a schedule. Your health care provider will recommend vaccines for you based on your age, medical history, and lifestyle or other factors, such as travel or where you work.   What tests do I need? Blood tests  Lipid and cholesterol levels. These may be checked every 5 years starting at age 68.  Hepatitis C test.  Hepatitis B test. Screening  Diabetes screening. This is done by checking your blood sugar (glucose) after you have not eaten for a while (fasting).  STD (sexually transmitted  disease) testing, if you are at risk.  BRCA-related cancer screening. This may be done if you have a family history of breast, ovarian, tubal, or peritoneal cancers.  Pelvic exam and Pap test. This may be done every 3 years starting at age 70. Starting at age 92, this may be done every 5 years if you have a Pap test in combination with an HPV test. Talk with your health care provider about your test results, treatment options, and if necessary, the need for more tests.   Follow these instructions at home: Eating and drinking  Eat a healthy diet that includes fresh fruits and vegetables, whole grains, lean protein, and low-fat dairy products.  Take vitamin and mineral supplements as recommended by your health care provider.  Do not drink alcohol if: ? Your health care provider tells you not to drink. ? You are pregnant, may be pregnant, or are planning to become pregnant.  If you drink alcohol: ? Limit how much you have to 0-1 drink a day. ? Be aware of how much alcohol is in your drink. In the [place] one drink equals one 12 oz bottle of beer (355 mL), one 5 oz glass of wine (148 mL), or one 1 oz glass of hard liquor (44 mL).   Lifestyle  Take daily care of your teeth and gums. Brush your teeth every morning and night with fluoride toothpaste. Floss one time each day.  Stay active. Exercise for at least 30 minutes 5 or more days each week.  Do not use any products that contain nicotine or tobacco, such as cigarettes,  e-cigarettes, and chewing tobacco. If you need help quitting, ask your health care provider.  Do not use drugs.  If you are sexually active, practice safe sex. Use a condom or other form of protection to prevent STIs (sexually transmitted infections).  If you do not wish to become pregnant, use a form of birth control. If you plan to become pregnant, see your health care provider for a prepregnancy visit.  Find healthy ways to cope with stress, such as: ? Meditation,  yoga, or listening to music. ? Journaling. ? Talking to a trusted person. ? Spending time with friends and family. Safety  Always wear your seat belt while driving or riding in a vehicle.  Do not drive: ? If you have been drinking alcohol. Do not ride with someone who has been drinking. ? When you are tired or distracted. ? While texting.  Wear a helmet and other protective equipment during sports activities.  If you have firearms in your house, make sure you follow all gun safety procedures.  Seek help if you have been physically or sexually abused. What's next?  Go to your health care provider once a year for an annual wellness visit.  Ask your health care provider how often you should have your eyes and teeth checked.  Stay up to date on all vaccines. This information is not intended to replace advice given to you by your health care provider. Make sure you discuss any questions you have with your health care provider. Document Revised: 06/30/2020 Document Reviewed: 07/14/2018 Elsevier Patient Education  2021 [employer].

## 2022-02-13 IMAGING — US US MFM FETAL BPP W/O NON-STRESS
1 series · 13 of 18 positions shown · non-contrast
Comparison: none

[Series 1: us mfm fetal bpp w/o non-stress · 18 acquisitions, 13 frames shown]
[im 1/18]
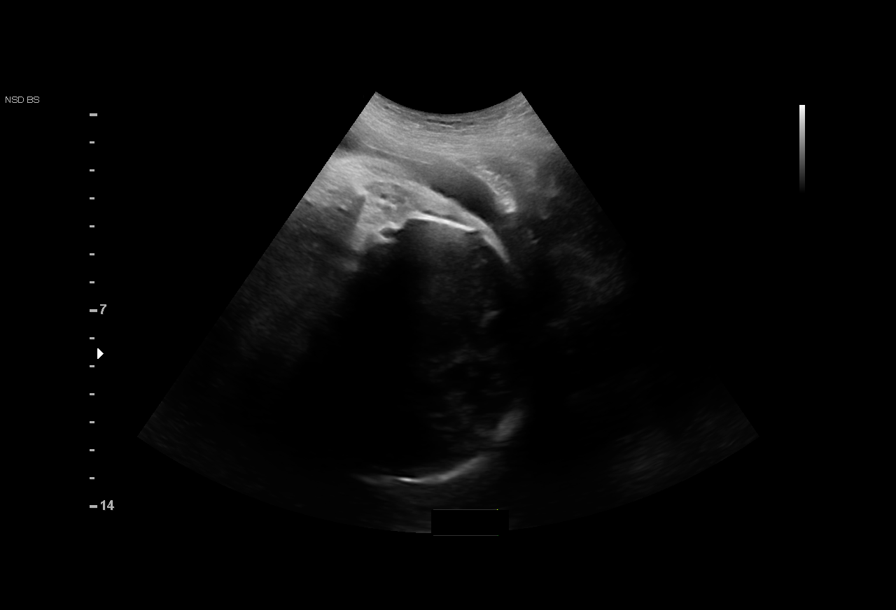
[im 3/18]
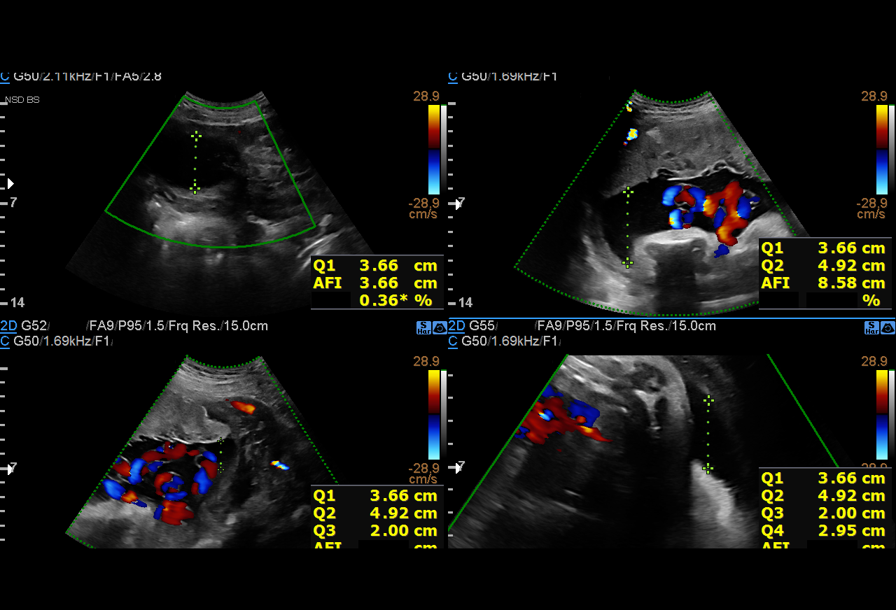
[im 4/18]
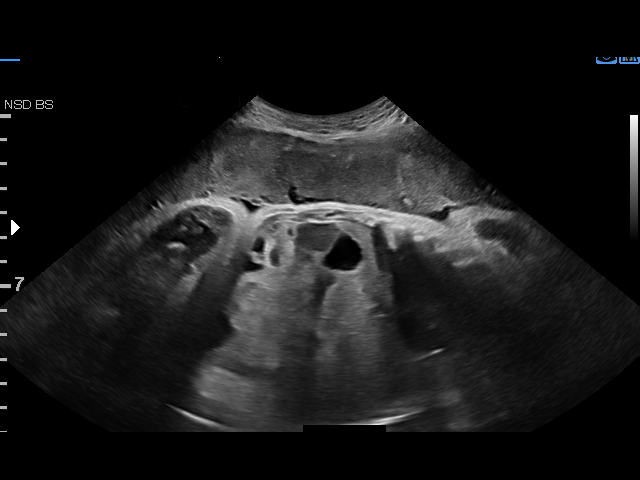
[im 5/18]
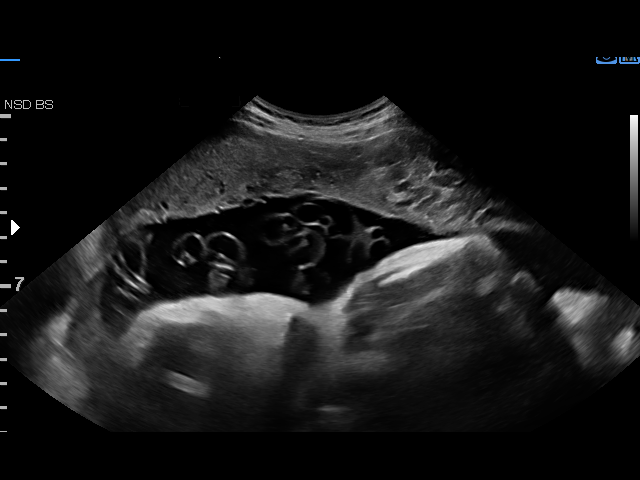
[im 7/18]
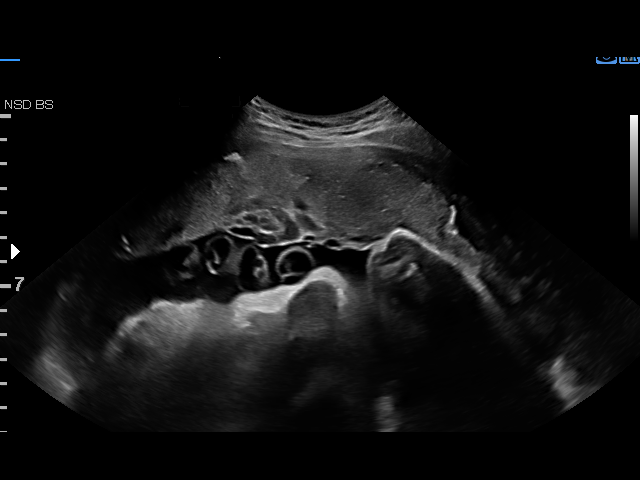
[im 8/18]
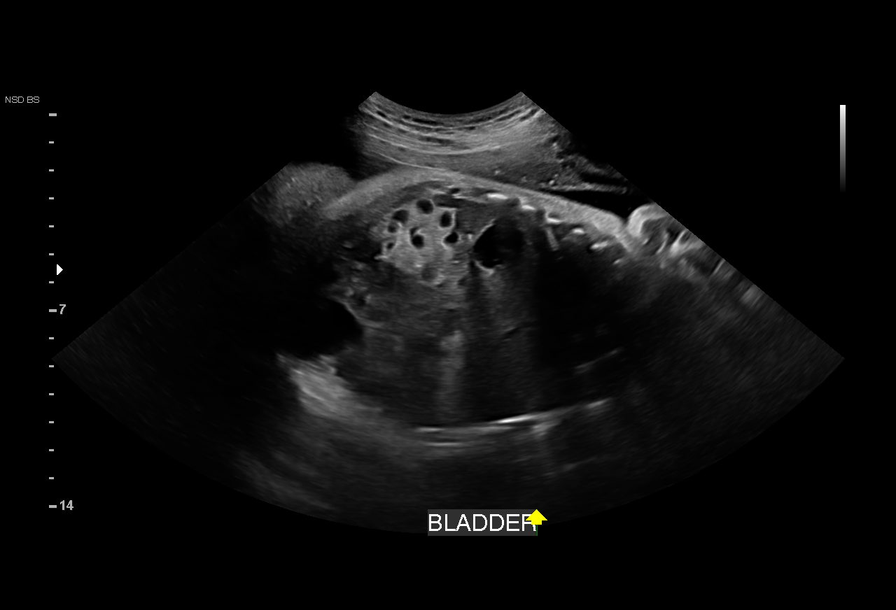
[im 10/18]
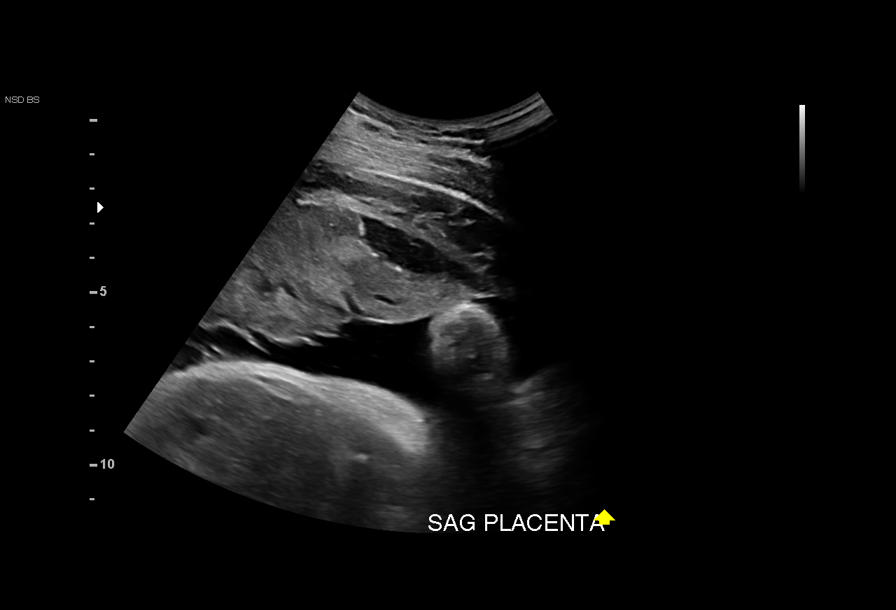
[im 11/18]
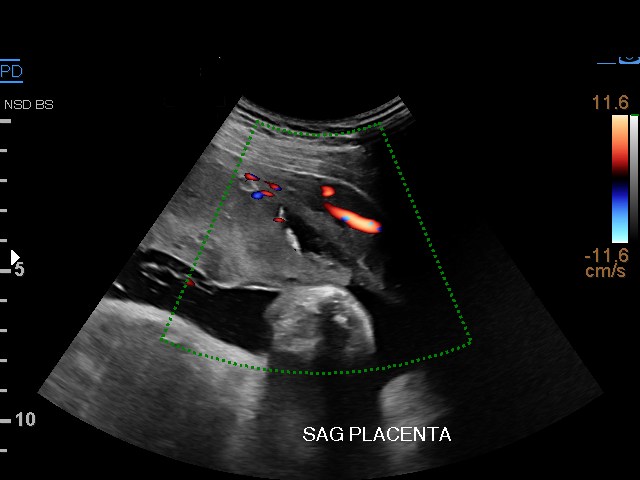
[im 12/18]
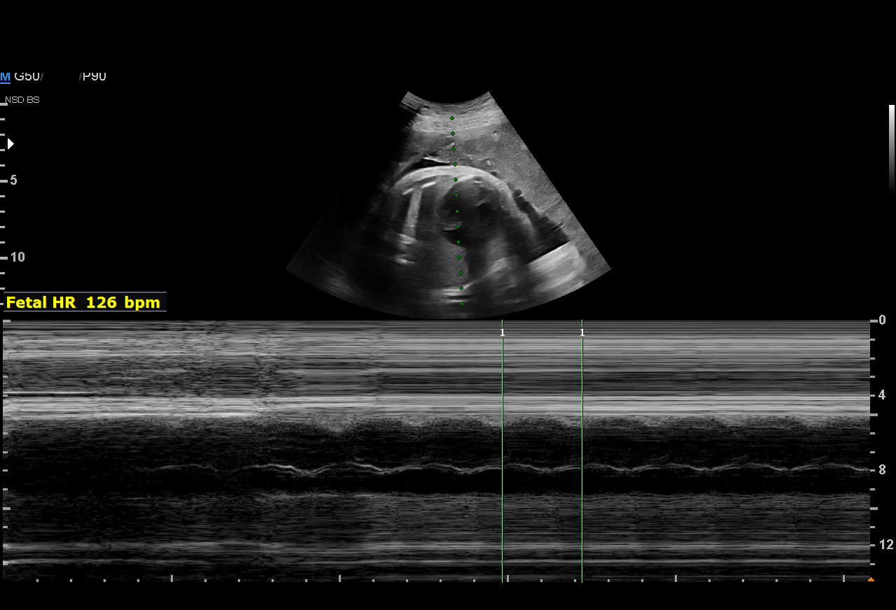
[im 14/18]
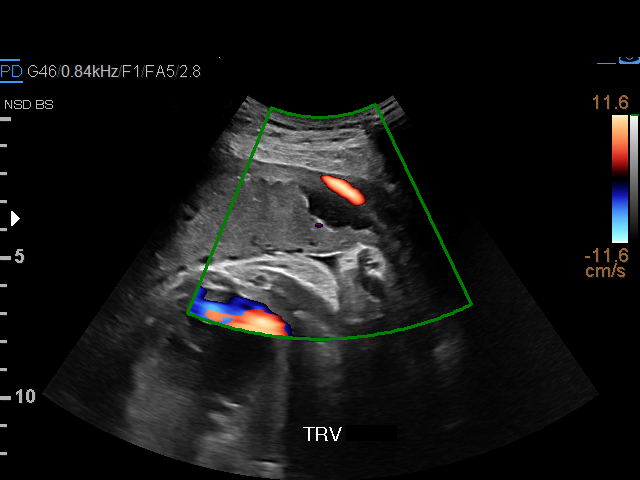
[im 15/18]
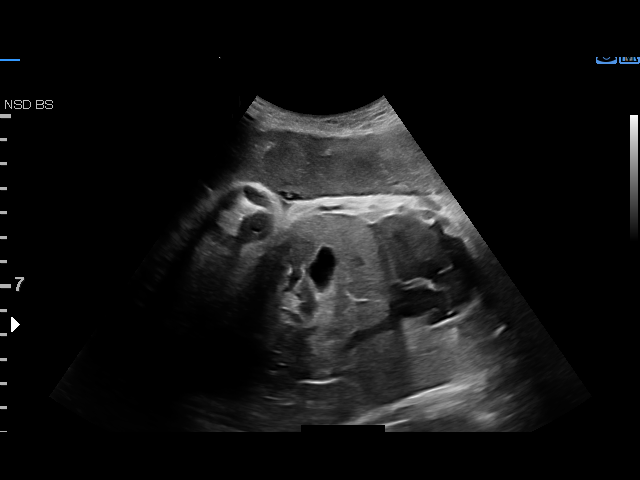
[im 16/18]
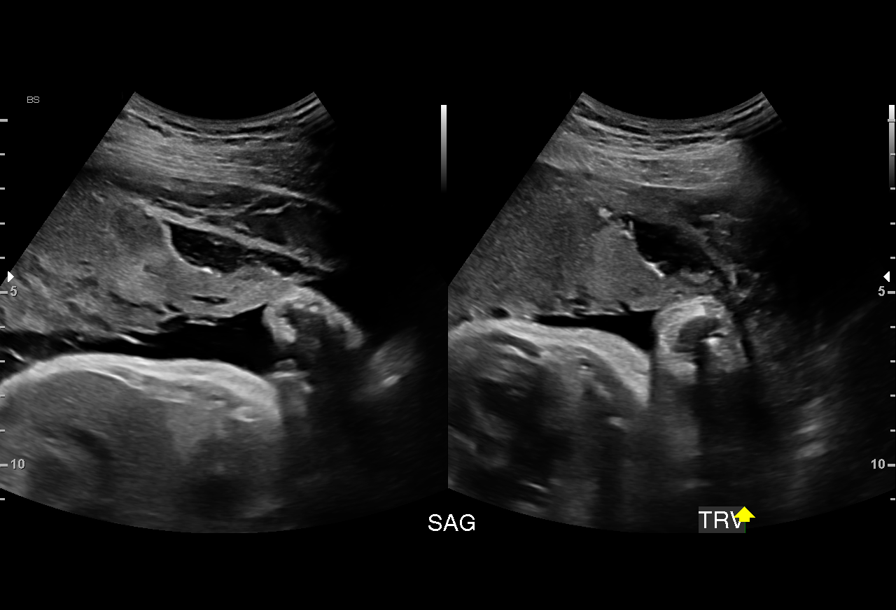
[im 18/18]
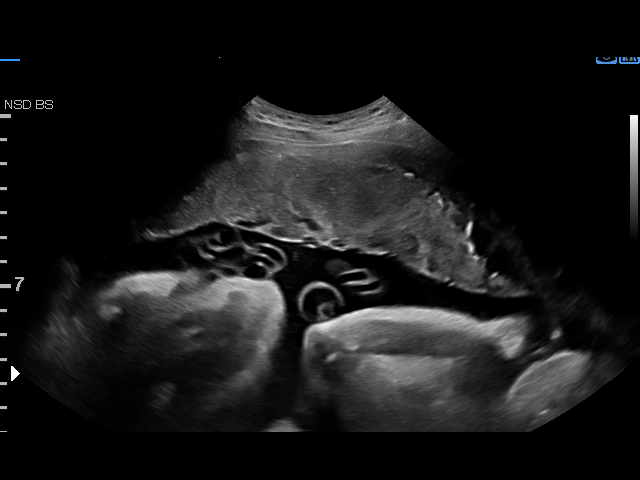

[13 of 18 positions shown; findings below may reference images not displayed]

Attending:        Pejuric Hajka      Secondary Phy.:    [facility]/Triage

Indications

 Decreased fetal movement
 39 weeks gestation of pregnancy
Fetal Evaluation

 Num Of Fetuses:          1
 Fetal Heart Rate(bpm):   129
 Cardiac Activity:        Observed
 Presentation:            Cephalic

 Amniotic Fluid
 AFI FV:      Within normal limits

 AFI Sum(cm)     %Tile       Largest Pocket(cm)
 13.53           56

 RUQ(cm)       RLQ(cm)       LUQ(cm)        LLQ(cm)
 3.66          2.95          4.92           2

 Comment:    ? Small inferior marginal abruption measures 4.2 x 1.1 x 2cm.
Biophysical Evaluation

 Amniotic F.V:   Within normal limits       F. Tone:         Observed
 F. Movement:    Observed                   Score:           [DATE]
 F. Breathing:   Observed
OB History

 Gravidity:    1         Term:   0        Prem:   0        SAB:   0
 TOP:          0       Ectopic:  0        Living: 0
Gestational Age

 Clinical EDD:  39w 6d                                        EDD:   02/11/21
 Best:          39w 6d     Det. By:  Clinical EDD             EDD:   02/11/21
Impression

 Antenatal testing for decreased fetal movement.
 Normal amniotic fluid and fetal movement.
 Cephalic presentation.
 Biophysical profile [DATE]
 Small marginal subplacental hypoechoic area suspicious. No
 color flow noted. Differential diagnosis includes placental lake
 vs. subchorionic hemorrhage.
Recommendations

 Clinical correlation recommended.
